# Patient Record
Sex: Female | Born: 1965 | Race: White | Hispanic: No | Smoking: Current every day smoker
Health system: Southern US, Community
[De-identification: ages and names within clinical notes are randomized; demographics above are authoritative.]

## PROBLEM LIST (undated history)

## (undated) ENCOUNTER — Ambulatory Visit: Payer: BC Managed Care – PPO

## (undated) DIAGNOSIS — K219 Gastro-esophageal reflux disease without esophagitis: Secondary | ICD-10-CM

## (undated) DIAGNOSIS — I10 Essential (primary) hypertension: Secondary | ICD-10-CM

## (undated) DIAGNOSIS — J449 Chronic obstructive pulmonary disease, unspecified: Secondary | ICD-10-CM

## (undated) DIAGNOSIS — E7800 Pure hypercholesterolemia, unspecified: Secondary | ICD-10-CM

## (undated) DIAGNOSIS — F419 Anxiety disorder, unspecified: Secondary | ICD-10-CM

## (undated) DIAGNOSIS — F32A Depression, unspecified: Secondary | ICD-10-CM

## (undated) DIAGNOSIS — T7840XA Allergy, unspecified, initial encounter: Secondary | ICD-10-CM

## (undated) DIAGNOSIS — M1990 Unspecified osteoarthritis, unspecified site: Secondary | ICD-10-CM

## (undated) HISTORY — DX: Allergy, unspecified, initial encounter: T78.40XA

## (undated) HISTORY — DX: Depression, unspecified: F32.A

## (undated) HISTORY — PX: OTHER SURGICAL HISTORY: SHX169

## (undated) HISTORY — PX: ABDOMINAL HYSTERECTOMY: SHX81

## (undated) HISTORY — DX: Anxiety disorder, unspecified: F41.9

## (undated) HISTORY — DX: Chronic obstructive pulmonary disease, unspecified: J44.9

## (undated) HISTORY — PX: TUBAL LIGATION: SHX77

## (undated) HISTORY — PX: JOINT REPLACEMENT: SHX530

## (undated) HISTORY — PX: REPLACEMENT TOTAL KNEE: SUR1224

---

## 2004-10-23 ENCOUNTER — Emergency Department: Payer: Self-pay | Admitting: Emergency Medicine

## 2005-12-29 ENCOUNTER — Emergency Department: Payer: Self-pay | Admitting: Unknown Physician Specialty

## 2006-12-26 ENCOUNTER — Emergency Department: Payer: Self-pay | Admitting: Emergency Medicine

## 2007-05-25 ENCOUNTER — Emergency Department: Payer: Self-pay | Admitting: Emergency Medicine

## 2008-07-19 ENCOUNTER — Ambulatory Visit: Payer: Self-pay | Admitting: Internal Medicine

## 2008-07-19 LAB — CONVERTED CEMR LAB
ALT: 24 units/L (ref 0–35)
Alkaline Phosphatase: 83 units/L (ref 39–117)
CO2: 22 meq/L (ref 19–32)
Cholesterol: 207 mg/dL — ABNORMAL HIGH (ref 0–200)
Creatinine, Ser: 0.84 mg/dL (ref 0.40–1.20)
GC Probe Amp, Urine: NEGATIVE
LDL Cholesterol: 109 mg/dL — ABNORMAL HIGH (ref 0–99)
Sodium: 136 meq/L (ref 135–145)
Total Bilirubin: 0.3 mg/dL (ref 0.3–1.2)
Total CHOL/HDL Ratio: 4.1
Total Protein: 7.4 g/dL (ref 6.0–8.3)
Triglycerides: 235 mg/dL — ABNORMAL HIGH (ref <150)
VLDL: 47 mg/dL — ABNORMAL HIGH (ref 0–40)

## 2008-08-02 ENCOUNTER — Ambulatory Visit: Payer: Self-pay | Admitting: Internal Medicine

## 2008-11-30 ENCOUNTER — Emergency Department: Payer: Self-pay | Admitting: Emergency Medicine

## 2009-02-21 ENCOUNTER — Emergency Department: Payer: Self-pay | Admitting: Emergency Medicine

## 2009-07-29 ENCOUNTER — Emergency Department: Payer: Self-pay | Admitting: Emergency Medicine

## 2009-08-05 ENCOUNTER — Emergency Department: Payer: Self-pay | Admitting: Emergency Medicine

## 2009-10-04 ENCOUNTER — Emergency Department: Payer: Self-pay | Admitting: Internal Medicine

## 2009-10-05 ENCOUNTER — Emergency Department: Payer: Self-pay | Admitting: Unknown Physician Specialty

## 2009-11-21 ENCOUNTER — Emergency Department: Payer: Self-pay | Admitting: Emergency Medicine

## 2010-01-23 ENCOUNTER — Emergency Department: Payer: Self-pay | Admitting: Emergency Medicine

## 2010-07-12 ENCOUNTER — Emergency Department: Payer: Self-pay | Admitting: Emergency Medicine

## 2011-02-06 ENCOUNTER — Emergency Department: Payer: Self-pay | Admitting: Unknown Physician Specialty

## 2011-03-15 ENCOUNTER — Inpatient Hospital Stay: Payer: Self-pay | Admitting: Psychiatry

## 2011-06-04 ENCOUNTER — Inpatient Hospital Stay: Payer: Self-pay | Admitting: Internal Medicine

## 2011-06-05 ENCOUNTER — Inpatient Hospital Stay: Payer: Self-pay | Admitting: Psychiatry

## 2011-06-19 ENCOUNTER — Emergency Department: Payer: Self-pay | Admitting: Emergency Medicine

## 2011-10-30 ENCOUNTER — Emergency Department: Payer: Self-pay | Admitting: *Deleted

## 2012-05-15 ENCOUNTER — Emergency Department: Payer: Self-pay | Admitting: *Deleted

## 2012-05-15 LAB — URINALYSIS, COMPLETE
Bilirubin,UR: NEGATIVE
Ph: 5 (ref 4.5–8.0)
Protein: 100
RBC,UR: 16 /HPF (ref 0–5)
WBC UR: NONE SEEN /HPF (ref 0–5)

## 2012-09-25 ENCOUNTER — Emergency Department: Payer: Self-pay | Admitting: Emergency Medicine

## 2012-10-29 ENCOUNTER — Emergency Department: Payer: Self-pay | Admitting: Emergency Medicine

## 2012-12-02 ENCOUNTER — Emergency Department: Payer: Self-pay | Admitting: Unknown Physician Specialty

## 2014-05-16 ENCOUNTER — Emergency Department: Payer: Self-pay | Admitting: Internal Medicine

## 2016-08-16 ENCOUNTER — Emergency Department
Admission: EM | Admit: 2016-08-16 | Discharge: 2016-08-16 | Disposition: A | Discharge status: Home / Self Care | Payer: Self-pay | Attending: Emergency Medicine | Admitting: Emergency Medicine

## 2016-08-16 ENCOUNTER — Emergency Department: Payer: Self-pay

## 2016-08-16 DIAGNOSIS — R1011 Right upper quadrant pain: Secondary | ICD-10-CM | POA: Insufficient documentation

## 2016-08-16 HISTORY — DX: Unspecified osteoarthritis, unspecified site: M19.90

## 2016-08-16 HISTORY — DX: Gastro-esophageal reflux disease without esophagitis: K21.9

## 2016-08-16 HISTORY — DX: Essential (primary) hypertension: I10

## 2016-08-16 HISTORY — DX: Pure hypercholesterolemia, unspecified: E78.00

## 2016-08-16 LAB — URINALYSIS COMPLETE WITH MICROSCOPIC (ARMC ONLY)
BILIRUBIN URINE: NEGATIVE
Bacteria, UA: NONE SEEN
GLUCOSE, UA: NEGATIVE mg/dL
Leukocytes, UA: NEGATIVE
NITRITE: NEGATIVE
Protein, ur: NEGATIVE mg/dL
SPECIFIC GRAVITY, URINE: 1.009 (ref 1.005–1.030)
pH: 7 (ref 5.0–8.0)

## 2016-08-16 LAB — LIPASE, BLOOD: LIPASE: 35 U/L (ref 11–51)

## 2016-08-16 LAB — COMPREHENSIVE METABOLIC PANEL
ALBUMIN: 4.1 g/dL (ref 3.5–5.0)
ALT: 38 U/L (ref 14–54)
ANION GAP: 9 (ref 5–15)
AST: 36 U/L (ref 15–41)
Alkaline Phosphatase: 95 U/L (ref 38–126)
BILIRUBIN TOTAL: 0.9 mg/dL (ref 0.3–1.2)
BUN: 13 mg/dL (ref 6–20)
CO2: 27 mmol/L (ref 22–32)
Calcium: 9.4 mg/dL (ref 8.9–10.3)
Chloride: 99 mmol/L — ABNORMAL LOW (ref 101–111)
Creatinine, Ser: 0.75 mg/dL (ref 0.44–1.00)
GFR calc non Af Amer: >60 mL/min (ref >60)
GLUCOSE: 127 mg/dL — AB (ref 65–99)
POTASSIUM: 3.7 mmol/L (ref 3.5–5.1)
Sodium: 135 mmol/L (ref 135–145)
TOTAL PROTEIN: 7.5 g/dL (ref 6.5–8.1)

## 2016-08-16 LAB — CBC
HEMATOCRIT: 41.6 % (ref 35.0–47.0)
HEMOGLOBIN: 14.6 g/dL (ref 12.0–16.0)
MCH: 30.7 pg (ref 26.0–34.0)
MCHC: 35 g/dL (ref 32.0–36.0)
MCV: 87.6 fL (ref 80.0–100.0)
Platelets: 204 10*3/uL (ref 150–440)
RBC: 4.75 MIL/uL (ref 3.80–5.20)
RDW: 13.4 % (ref 11.5–14.5)
WBC: 10 10*3/uL (ref 3.6–11.0)

## 2016-08-16 LAB — TROPONIN I

## 2016-08-16 MED ORDER — MORPHINE SULFATE (PF) 4 MG/ML IV SOLN
4.0000 mg | Freq: Once | INTRAVENOUS | Status: AC
  Administered 2016-08-16: 4 mg via INTRAVENOUS

## 2016-08-16 MED ORDER — ONDANSETRON HCL 4 MG/2ML IJ SOLN
INTRAMUSCULAR | Status: AC
  Administered 2016-08-16: 4 mg via INTRAVENOUS
  Filled 2016-08-16: qty 2

## 2016-08-16 MED ORDER — ONDANSETRON HCL 4 MG/2ML IJ SOLN
4.0000 mg | Freq: Once | INTRAMUSCULAR | Status: AC
  Administered 2016-08-16: 4 mg via INTRAVENOUS

## 2016-08-16 MED ORDER — SODIUM CHLORIDE 0.9 % IV BOLUS (SEPSIS)
1000.0000 mL | Freq: Once | INTRAVENOUS | Status: AC
  Administered 2016-08-16: 1000 mL via INTRAVENOUS

## 2016-08-16 MED ORDER — MORPHINE SULFATE (PF) 4 MG/ML IV SOLN
INTRAVENOUS | Status: AC
  Administered 2016-08-16: 4 mg via INTRAVENOUS
  Filled 2016-08-16: qty 1

## 2016-08-16 MED ORDER — HYDROCODONE-ACETAMINOPHEN 5-325 MG PO TABS: 1.0000 | ORAL_TABLET | ORAL | 0 refills | Status: DC | PRN

## 2016-08-16 NOTE — ED Provider Notes (Signed)
[facility] Emergency Department Provider Note  Time seen: 9:41 AM  I have reviewed the triage vital signs and the nursing notes.   HISTORY  Chief Complaint Abdominal Pain    HPI Laurie Mclaughlin is a 50 y.o. female with no significant past medical history who presents to the emergency department with epigastric and right upper quadrant pain. According to the patient for the past 23 days she has been feeling somewhat nauseated, especially after she eats food. States she has vomited several times. She states this morning she began experiencing epigastric and right upper quadrant pain only dry heaving. Denies any history of the same. States she still has her gallbladder intact. Denies fever. Denies dysuria. Denies lower abdominal pain. Denies diarrhea.Describes his discomfort as a burning sensation located in the epigastrium and right upper quadrant, moderate in severity.  No past medical history on file.  There are no active problems to display for this patient.   No past surgical history on file.  Prior to Admission medications   Not on File    Allergies not on file  No family history on file.  Social History Social History  Substance Use Topics  . Smoking status: Not on file  . Smokeless tobacco: Not on file  . Alcohol use Not on file    Review of Systems Constitutional: Negative for fever. Cardiovascular: Negative for chest pain. Respiratory: Negative for shortness of breath. Gastrointestinal: Epigastric/right upper quadrant pain. Positive for nausea, vomiting. Genitourinary: Negative for dysuria. Musculoskeletal: Patient states some radiation of pain to the back. Neurological: Negative for headache 10-point ROS otherwise negative.  ____________________________________________   PHYSICAL EXAM:  Constitutional: Alert and oriented. Well appearing and in no distress. Eyes: Normal exam ENT   Head: Normocephalic and atraumatic.    Mouth/Throat: Mucous membranes are moist. Cardiovascular: Normal rate, regular rhythm. No murmur Respiratory: Normal respiratory effort without tachypnea nor retractions. Breath sounds are clear Gastrointestinal: Soft, moderate epigastric and right upper quadrant tenderness, no rebound or guarding. No distention. Musculoskeletal: Nontender with normal range of motion in all extremities.  Neurologic:  Normal speech and language. No gross focal neurologic deficits are appreciated. Skin:  Skin is warm, dry and intact.  Psychiatric: Mood and affect are normal.   ____________________________________________   RADIOLOGY  Ultrasound shows fatty liver otherwise negative  ____________________________________________   INITIAL IMPRESSION / ASSESSMENT AND PLAN / ED COURSE  Pertinent labs & imaging results that were available during my care of the patient were reviewed by me and considered in my medical decision making (see chart for details).  The patient presents to the emergency department with epigastric right upper quadrant pain. Patient states nausea and vomiting for the past 2-3 days, but pain started this morning. We will check labs, obtain an ultrasound of the RIGHT UPPER QUADRANT, TREAT PAIN AND NAUSEA, IV HYDRATE, AND CLOSELY MONITOR IN THE EMERGENCY DEPARTMENT WHILE AWAITING RESULTS.  Ultrasound consistent with fatty liver disease, otherwise negative. Patient states her pain is much improved. We'll discharge with a short course of pain medication and PCP follow-up. ____________________________________________   FINAL CLINICAL IMPRESSION(S) / ED DIAGNOSES  Upper abdominal pain Nausea and vomiting Hepatic steatosis   Minna AntisKevin Valmore Arabie, MD 08/16/16 1142

## 2016-08-16 NOTE — ED Notes (Signed)

## 2016-08-16 NOTE — ED Triage Notes (Signed)
Pt came to ED via EMS c/o right upper quadrant abdominal pain. Reports pain for the past few days, nausea and vomiting. Reports a burning sensation.

## 2019-10-14 ENCOUNTER — Other Ambulatory Visit: Payer: Self-pay

## 2019-10-14 ENCOUNTER — Emergency Department
Admission: EM | Admit: 2019-10-14 | Discharge: 2019-10-14 | Disposition: A | Discharge status: Home / Self Care | Payer: Self-pay | Attending: Emergency Medicine | Admitting: Emergency Medicine

## 2019-10-14 DIAGNOSIS — K0889 Other specified disorders of teeth and supporting structures: Secondary | ICD-10-CM | POA: Insufficient documentation

## 2019-10-14 DIAGNOSIS — I10 Essential (primary) hypertension: Secondary | ICD-10-CM | POA: Insufficient documentation

## 2019-10-14 DIAGNOSIS — F17210 Nicotine dependence, cigarettes, uncomplicated: Secondary | ICD-10-CM | POA: Insufficient documentation

## 2019-10-14 MED ORDER — HYDROCODONE-ACETAMINOPHEN 5-325 MG PO TABS: 1.0000 | ORAL_TABLET | Freq: Four times a day (QID) | ORAL | 0 refills | Status: DC | PRN

## 2019-10-14 MED ORDER — CLINDAMYCIN HCL 150 MG PO CAPS
300.0000 mg | ORAL_CAPSULE | Freq: Once | ORAL | Status: AC
  Administered 2019-10-14: 300 mg via ORAL
  Filled 2019-10-14: qty 2

## 2019-10-14 MED ORDER — HYDROCODONE-ACETAMINOPHEN 5-325 MG PO TABS
1.0000 | ORAL_TABLET | Freq: Once | ORAL | Status: AC
  Administered 2019-10-14: 1 via ORAL
  Filled 2019-10-14: qty 1

## 2019-10-14 MED ORDER — ONDANSETRON 4 MG PO TBDP
4.0000 mg | ORAL_TABLET | Freq: Once | ORAL | Status: AC
  Administered 2019-10-14: 4 mg via ORAL
  Filled 2019-10-14: qty 1

## 2019-10-14 MED ORDER — HYDROCODONE-ACETAMINOPHEN 5-325 MG PO TABS: 1.0000 | ORAL_TABLET | Freq: Four times a day (QID) | ORAL | 0 refills | Status: AC | PRN

## 2019-10-14 NOTE — ED Notes (Signed)
Pt c/o dental pain and not being able to sleep. Requesting w/c to the room.

## 2019-10-14 NOTE — ED Triage Notes (Signed)
Pt to the er via ems for dental pain. Pt has a broken tooth on the bottom right side around the crown. Pt has not been seen. Pt thinks she has an abcess. Pt has been taking amoxicillan someone gave her. Pt says she has never had a toothache like this. Pt says the pain stopped for a week.

## 2019-10-14 NOTE — ED Provider Notes (Signed)
[facility] Emergency Department Provider Note  ____________________________________________  Time seen: Approximately 8:49 PM  I have reviewed the triage vital signs and the nursing notes.   HISTORY  Chief Complaint Dental Pain    HPI Laurie Mclaughlin is a 53 y.o. female presents to the emergency department with inferior 29 and 28 pain.  Patient states that she has had some mild right-sided lower jaw discomfort and swelling.  She has been taking a friend amoxicillin.  Patient states that she called EMS today as she could not take the pain any longer.  She denies pain underneath the tongue or fever or chills.  She has been able to swallow easily.  Patient states that she has not made an appointment with a local dentist as she does not have dental insurance.        Past Medical History:  Diagnosis Date  . Acid reflux   . Arthritis   . High cholesterol   . Hypertension     There are no active problems to display for this patient.   Past Surgical History:  Procedure Laterality Date  . ABDOMINAL HYSTERECTOMY    . REPLACEMENT TOTAL KNEE    . rotator cuff surgery      Prior to Admission medications   Medication Sig Start Date End Date Taking? Authorizing Provider  HYDROcodone-acetaminophen (NORCO) 5-325 MG tablet Take 1 tablet by mouth every 6 (six) hours as needed for up to 3 days for moderate pain. 10/14/19 10/17/19  Orvil Feil, PA-C    Allergies Hydrochlorothiazide  No family history on file.  Social History Social History   Tobacco Use  . Smoking status: Current Every Day Smoker    Packs/day: 0.50    Types: Cigarettes  . Smokeless tobacco: Never Used  Substance Use Topics  . Alcohol use: Not Currently    Comment: ocassionally  . Drug use: Never     Review of Systems  Constitutional: No fever/chills Eyes: No visual changes. No discharge ENT: Patient has Inferior 28 and 29 pain.  Cardiovascular: no chest pain. Respiratory: no  cough. No SOB. Gastrointestinal: No abdominal pain.  No nausea, no vomiting.  No diarrhea.  No constipation. Genitourinary: Negative for dysuria. No hematuria Musculoskeletal: Negative for musculoskeletal pain. Skin: Negative for rash, abrasions, lacerations, ecchymosis. Neurological: Negative for headaches, focal weakness or numbness.   ____________________________________________   PHYSICAL EXAM:  VITAL SIGNS: ED Triage Vitals  Enc Vitals Group     BP 10/14/19 1934 (!) 184/95     Pulse Rate 10/14/19 1934 98     Resp 10/14/19 1934 18     Temp 10/14/19 1934 98.8 F (37.1 C)     Temp Source 10/14/19 1934 Oral     SpO2 10/14/19 1934 97 %     Weight 10/14/19 1931 180 lb (81.6 kg)     Height 10/14/19 1931 5\' 7"  (1.702 m)     Head Circumference --      Peak Flow --      Pain Score 10/14/19 1930 10     Pain Loc --      Pain Edu? --      Excl. in GC? --      Constitutional: Alert and oriented. Well appearing and in no acute distress. Eyes: Conjunctivae are normal. PERRL. EOMI. Head: Atraumatic. ENT:      Ears: TMs are pearly.       Nose: No congestion/rhinnorhea.      Mouth/Throat: Mucous membranes are moist.  No  pain to palpation underneath the tongue.  Mild swelling of the right lower jaw visualized. Neck: No stridor.  No cervical spine tenderness to palpation. Cardiovascular: Normal rate, regular rhythm. Normal S1 and S2.  Good peripheral circulation. Respiratory: Normal respiratory effort without tachypnea or retractions. Lungs CTAB. Good air entry to the bases with no decreased or absent breath sounds. Skin:  Skin is warm, dry and intact. No rash noted. Psychiatric: Mood and affect are normal. Speech and behavior are normal. Patient exhibits appropriate insight and judgement.   ____________________________________________   LABS (all labs ordered are listed, but only abnormal results are displayed)  Labs Reviewed - No data to  display ____________________________________________  EKG   ____________________________________________  RADIOLOGY  No results found.  ____________________________________________    PROCEDURES  Procedure(s) performed:    Procedures    Medications  HYDROcodone-acetaminophen (NORCO/VICODIN) 5-325 MG per tablet 1 tablet (has no administration in time range)  clindamycin (CLEOCIN) capsule 300 mg (has no administration in time range)  ondansetron (ZOFRAN-ODT) disintegrating tablet 4 mg (has no administration in time range)     ____________________________________________   INITIAL IMPRESSION / ASSESSMENT AND PLAN / ED COURSE  Pertinent labs & imaging results that were available during my care of the patient were reviewed by me and considered in my medical decision making (see chart for details).  Review of the [place] CSRS was performed in accordance of the [place] prior to dispensing any controlled drugs.           Assessment and plan Dental pain 53 year old female presents to the emergency department with inferior 28 and 29 pain that has been going on for the past 2 days.  On physical exam, patient had no pain underneath the tongue was able to swallow easily.  Norco was given in the emergency department for pain and patient was discharged with a short course of [place] drug database was reviewed during this emergency department encounter and patient was cautioned that a refill of Norco would not be given should she return to the emergency department with refractory dental pain.  She was advised to make an appointment with a local dentist as soon as possible.  Patient was transitioned to clindamycin.    ____________________________________________  FINAL CLINICAL IMPRESSION(S) / ED DIAGNOSES  Final diagnoses:  Pain, dental      NEW MEDICATIONS STARTED DURING THIS VISIT:  ED Discharge Orders         Ordered    HYDROcodone-acetaminophen ([place])  5-325 MG tablet  Every 6 hours PRN,   Status:  Discontinued     10/14/19 2046    HYDROcodone-acetaminophen ([place]) 5-325 MG tablet  Every 6 hours PRN,   Status:  Discontinued     10/14/19 2046    HYDROcodone-acetaminophen ([place]) 5-325 MG tablet  Every 6 hours PRN     10/14/19 2047              This chart was dictated using voice recognition software/Dragon. Despite best efforts to proofread, errors can occur which can change the meaning. Any change was purely unintentional.    Lannie Fields, PA-C 10/14/19 2053    Nena Polio, MD 10/14/19 2312

## 2019-10-14 NOTE — ED Notes (Signed)
Pt took 1200mg  IBU about 1100am. Pt has not taken her lisinopril recently as she is out.

## 2022-01-24 ENCOUNTER — Emergency Department: Payer: Self-pay

## 2022-01-24 ENCOUNTER — Emergency Department
Admission: EM | Admit: 2022-01-24 | Discharge: 2022-01-24 | Disposition: A | Discharge status: Home / Self Care | Payer: Self-pay | Attending: Emergency Medicine | Admitting: Emergency Medicine

## 2022-01-24 ENCOUNTER — Other Ambulatory Visit: Payer: Self-pay

## 2022-01-24 DIAGNOSIS — L03116 Cellulitis of left lower limb: Secondary | ICD-10-CM | POA: Insufficient documentation

## 2022-01-24 DIAGNOSIS — I10 Essential (primary) hypertension: Secondary | ICD-10-CM | POA: Insufficient documentation

## 2022-01-24 LAB — BASIC METABOLIC PANEL
Anion gap: 7 (ref 5–15)
BUN: 19 mg/dL (ref 6–20)
CO2: 30 mmol/L (ref 22–32)
Calcium: 9.3 mg/dL (ref 8.9–10.3)
Chloride: 99 mmol/L (ref 98–111)
Creatinine, Ser: 0.78 mg/dL (ref 0.44–1.00)
GFR, Estimated: >60 mL/min (ref >60)
Glucose, Bld: 104 mg/dL — ABNORMAL HIGH (ref 70–99)
Potassium: 3.8 mmol/L (ref 3.5–5.1)
Sodium: 136 mmol/L (ref 135–145)

## 2022-01-24 LAB — CBC WITH DIFFERENTIAL/PLATELET
Abs Immature Granulocytes: 0.02 10*3/uL (ref 0.00–0.07)
Basophils Absolute: 0.1 10*3/uL (ref 0.0–0.1)
Basophils Relative: 1 %
Eosinophils Absolute: 0.2 10*3/uL (ref 0.0–0.5)
Eosinophils Relative: 3 %
HCT: 41.8 % (ref 36.0–46.0)
Hemoglobin: 13.6 g/dL (ref 12.0–15.0)
Immature Granulocytes: 0 %
Lymphocytes Relative: 33 %
Lymphs Abs: 2.5 10*3/uL (ref 0.7–4.0)
MCH: 28.5 pg (ref 26.0–34.0)
MCHC: 32.5 g/dL (ref 30.0–36.0)
MCV: 87.4 fL (ref 80.0–100.0)
Monocytes Absolute: 0.6 10*3/uL (ref 0.1–1.0)
Monocytes Relative: 8 %
Neutro Abs: 4.2 10*3/uL (ref 1.7–7.7)
Neutrophils Relative %: 55 %
Platelets: 209 10*3/uL (ref 150–400)
RBC: 4.78 MIL/uL (ref 3.87–5.11)
RDW: 12.6 % (ref 11.5–15.5)
WBC: 7.5 10*3/uL (ref 4.0–10.5)
nRBC: 0 % (ref 0.0–0.2)

## 2022-01-24 LAB — URIC ACID: Uric Acid, Serum: 4.7 mg/dL (ref 2.5–7.1)

## 2022-01-24 MED ORDER — IBUPROFEN 600 MG PO TABS
600.0000 mg | ORAL_TABLET | Freq: Once | ORAL | Status: AC
  Administered 2022-01-24: 600 mg via ORAL
  Filled 2022-01-24: qty 1

## 2022-01-24 MED ORDER — CEPHALEXIN 500 MG PO CAPS
500.0000 mg | ORAL_CAPSULE | Freq: Once | ORAL | Status: AC
  Administered 2022-01-24: 500 mg via ORAL
  Filled 2022-01-24: qty 1

## 2022-01-24 MED ORDER — OXYCODONE-ACETAMINOPHEN 7.5-325 MG PO TABS: 1.0000 | ORAL_TABLET | Freq: Four times a day (QID) | ORAL | 0 refills | Status: DC | PRN

## 2022-01-24 MED ORDER — NAPROXEN 500 MG PO TABS: 500.0000 mg | ORAL_TABLET | Freq: Two times a day (BID) | ORAL | Status: DC

## 2022-01-24 MED ORDER — CEPHALEXIN 500 MG PO CAPS: 500.0000 mg | ORAL_CAPSULE | Freq: Four times a day (QID) | ORAL | 0 refills | Status: AC

## 2022-01-24 MED ORDER — OXYCODONE-ACETAMINOPHEN 5-325 MG PO TABS
1.0000 | ORAL_TABLET | Freq: Once | ORAL | Status: AC
  Administered 2022-01-24: 1 via ORAL
  Filled 2022-01-24: qty 1

## 2022-01-24 NOTE — Discharge Instructions (Addendum)
Read and follow discharge care instruction take medication as directed 

## 2022-01-24 NOTE — ED Provider Notes (Signed)
[facility] Provider Note    Event Date/Time   First MD Initiated Contact with Patient 01/24/22 1901     (approximate)   History   Foot Pain   HPI Laurie Mclaughlin is a 56 y.o. female patient presents for left foot pain and edema for few days.  Denies provoking incident for complaint except for prolonged standing.  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  Denies fever.  Denies dyspnea or shortness of breath.  Patient has a history of anxiety/depression, HLD, HTN, and peripheral edema.  Appears in no acute distress.     Physical Exam   Triage Vital Signs: ED Triage Vitals  Enc Vitals Group     BP 01/24/22 1839 137/84     Pulse Rate 01/24/22 1839 72     Resp 01/24/22 1839 16     Temp 01/24/22 1839 98 F (36.7 C)     Temp Source 01/24/22 1839 Oral     SpO2 01/24/22 1839 99 %     Weight 01/24/22 1839 200 lb (90.7 kg)     Height 01/24/22 1839 5\' 9"  (1.753 m)     Head Circumference --      Peak Flow --      Pain Score --      Pain Loc --      Pain Edu? --      Excl. in GC? --     Most recent vital signs: Vitals:   01/24/22 1839 01/24/22 2150  BP: 137/84 (!) 127/97  Pulse: 72 69  Resp: 16 14  Temp: 98 F (36.7 C)   SpO2: 99% 99%    General: Awake, no distress.  CV:  Palpable posterior tibialis and dorsalis pedis pulses.  No pitting edema. Resp:  Normal effort.  Abd:  No distention.  Other:  Mild edema and erythema to the dorsal aspect of left foot.   ED Results / Procedures / Treatments   Labs (all labs ordered are listed, but only abnormal results are displayed) Labs Reviewed  BASIC METABOLIC PANEL - Abnormal; Notable for the following components:      Result Value   Glucose, Bld 104 (*)    All other components within normal limits  URIC ACID  CBC WITH DIFFERENTIAL/PLATELET     EKG     RADIOLOGY No acute findings.  Incidental finding of a heel spur. Radiation allergy confirmed no acute  findings.  PROCEDURES:  Critical Care performed: No  Procedures   MEDICATIONS ORDERED IN ED: Medications  cephALEXin (KEFLEX) capsule 500 mg (has no administration in time range)  ibuprofen (ADVIL) tablet 600 mg (has no administration in time range)  oxyCODONE-acetaminophen (PERCOCET/ROXICET) 5-325 MG per tablet 1 tablet (has no administration in time range)     IMPRESSION / MDM / ASSESSMENT AND PLAN / ED COURSE  I reviewed the triage vital signs and the nursing notes.                              Differential diagnosis includes, but is not limited to, cellulitis, gout, and venous stasis.  Further evaluation with labs and x-rays warranted.    FINAL CLINICAL IMPRESSION(S) / ED DIAGNOSES   Final diagnoses:  Cellulitis of left lower extremity     Rx / DC Orders   ED Discharge Orders          Ordered    cephALEXin (KEFLEX) 500 MG capsule  4 times daily        01/24/22 2140    naproxen (NAPROSYN) 500 MG tablet  2 times daily with meals        01/24/22 2140    oxyCODONE-acetaminophen (PERCOCET) 7.5-325 MG tablet  Every 6 hours PRN        01/24/22 2140             Note:  This document was prepared using Dragon voice recognition software and may include unintentional dictation errors.    Joni Reining, PA-C 01/24/22 2151    Sharman Cheek, MD 01/25/22 [phone]

## 2022-01-24 NOTE — ED Triage Notes (Signed)
Pt c/o a swollen red area to the left foot for the past couple of days. Denies injury

## 2022-11-27 NOTE — Progress Notes (Deleted)
    New patient visit   Patient: Laurie Mclaughlin   DOB: [birth year]   56 y.o. Female  MRN: [id number] Visit Date: 12/04/2022  Today's healthcare provider: Debera Lat, PA-C   No chief complaint on file.  Subjective    Laurie Mclaughlin is a 56 y.o. female who presents today as a new patient to establish care.  HPI  ***  Past Medical History:  Diagnosis Date   Acid reflux    Arthritis    High cholesterol    Hypertension    Past Surgical History:  Procedure Laterality Date   ABDOMINAL HYSTERECTOMY     REPLACEMENT TOTAL KNEE     rotator cuff surgery     No family status information on file.   No family history on file. Social History   Socioeconomic History   Marital status: Divorced    Spouse name: Not on file   Number of children: Not on file   Years of education: Not on file   Highest education level: Not on file  Occupational History   Not on file  Tobacco Use   Smoking status: Every Day    Packs/day: 0.50    Types: Cigarettes   Smokeless tobacco: Never  Substance and Sexual Activity   Alcohol use: Not Currently    Comment: ocassionally   Drug use: Never   Sexual activity: Not on file  Other Topics Concern   Not on file  Social History Narrative   Not on file   Social Determinants of Health   Financial Resource Strain: Not on file  Food Insecurity: Not on file  Transportation Needs: Not on file  Physical Activity: Not on file  Stress: Not on file  Social Connections: Not on file   Outpatient Medications Prior to Visit  Medication Sig   naproxen (NAPROSYN) 500 MG tablet Take 1 tablet (500 mg total) by mouth 2 (two) times daily with a meal.   oxyCODONE-acetaminophen (PERCOCET) 7.5-325 MG tablet Take 1 tablet by mouth every 6 (six) hours as needed for severe pain.   No facility-administered medications prior to visit.   Allergies  Allergen Reactions   Hydrochlorothiazide      There is no immunization history on file for this patient.  Health  Maintenance  Topic Date Due   COVID-19 Vaccine (1) Never done   HIV Screening  Never done   Hepatitis C Screening  Never done   DTaP/Tdap/Td (1 - Tdap) Never done   PAP SMEAR-Modifier  Never done   COLONOSCOPY (Pts 45-55yrs Insurance coverage will need to be confirmed)  Never done   Lung Cancer Screening  Never done   MAMMOGRAM  Never done   Zoster Vaccines- Shingrix (1 of 2) Never done   INFLUENZA VACCINE  06/30/2022   HPV VACCINES  Aged Out    Patient Care Team: Patient, No Pcp Per as PCP - General (General Practice)  Review of Systems  {Labs  Heme  Chem  Endocrine  Serology  Results Review (optional):23779}   Objective    There were no vitals taken for this visit. {Show previous vital signs (optional):23777}  Physical Exam ***  Depression Screen     No data to display         No results found for any visits on 12/04/22.  Assessment & Plan     ***

## 2022-11-30 DIAGNOSIS — M75100 Unspecified rotator cuff tear or rupture of unspecified shoulder, not specified as traumatic: Secondary | ICD-10-CM

## 2022-11-30 HISTORY — DX: Unspecified rotator cuff tear or rupture of unspecified shoulder, not specified as traumatic: M75.100

## 2022-12-04 ENCOUNTER — Ambulatory Visit: Payer: Self-pay | Admitting: Physician Assistant

## 2022-12-07 ENCOUNTER — Ambulatory Visit: Payer: Self-pay

## 2022-12-10 ENCOUNTER — Ambulatory Visit
Admission: EM | Admit: 2022-12-10 | Discharge: 2022-12-10 | Disposition: A | Discharge status: Home / Self Care | Payer: BC Managed Care – PPO | Attending: Physician Assistant | Admitting: Physician Assistant

## 2022-12-10 ENCOUNTER — Ambulatory Visit (INDEPENDENT_AMBULATORY_CARE_PROVIDER_SITE_OTHER): Payer: BC Managed Care – PPO

## 2022-12-10 DIAGNOSIS — M25512 Pain in left shoulder: Secondary | ICD-10-CM

## 2022-12-10 DIAGNOSIS — S46002A Unspecified injury of muscle(s) and tendon(s) of the rotator cuff of left shoulder, initial encounter: Secondary | ICD-10-CM

## 2022-12-10 NOTE — Discharge Instructions (Addendum)
-  Your x-ray does not show any fractures. - You should follow-up with orthopedics for an MRI of your shoulder given your history of rotator cuff tear.  I am concerned you could have another tear.  Do not reach overhead as that may worsen your symptoms.  You may perform the physical therapy exercises at home as tolerated. - May continue with NSAIDs, Tylenol.  Consider topical Voltaren gel, ice, heat, lidocaine patches.  You have a condition requiring you to follow up with Orthopedics so please call one of the following office for appointment:   [facility] [address] [place], [place] [address] Phone: [phone]  [facility] [address], [place], [place] [address] Phone: [phone]

## 2022-12-10 NOTE — ED Provider Notes (Signed)
[facility]    CSN: [id number] Arrival date & time: 12/10/22  3382      History   Chief Complaint Chief Complaint  Patient presents with   Shoulder Injury    HPI Laurie Mclaughlin is a 57 y.o. female presenting for approximately 1 and half week history of left-sided shoulder pain and reduced range of motion.  She says that she tripped over her dog and fell down a couple stairs.  She is unsure if she fell onto the shoulder or if she threw her arm out to catch herself.  She has had difficulty with overhead reaching and has been unable to do so.  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  She reports that her pain was so significant she had to contact somebody to come pick her up from work because just turning the steering wheel caused significant pain.  She is right-handed.  She denies any pain in her neck and no pain radiating down her arm.  No numbness, weakness or tingling.  Has been taking Advil.  She has a history of rotator cuff tear of this shoulder many years ago.  She also reports that she just completed physical therapy for reduced range of motion in her right shoulder.  No other complaints.  HPI  Past Medical History:  Diagnosis Date   Acid reflux    Arthritis    High cholesterol    Hypertension     There are no problems to display for this patient.   Past Surgical History:  Procedure Laterality Date   ABDOMINAL HYSTERECTOMY     REPLACEMENT TOTAL KNEE     rotator cuff surgery      OB History   No obstetric history on file.      Home Medications    Prior to Admission medications   Not on File    Family History History reviewed. No pertinent family history.  Social History Social History   Tobacco Use   Smoking status: Every Day    Packs/day: 0.50    Types: Cigarettes   Smokeless tobacco: Never  Substance Use Topics   Alcohol use: Not Currently    Comment: ocassionally   Drug use: Never     Allergies    Hydrochlorothiazide   Review of Systems Review of Systems  Musculoskeletal:  Positive for arthralgias. Negative for back pain, joint swelling and neck pain.  Skin:  Negative for color change and wound.  Neurological:  Negative for weakness and numbness.     Physical Exam Triage Vital Signs ED Triage Vitals  Enc Vitals Group     BP 12/10/22 0831 118/79     Pulse Rate 12/10/22 0831 79     Resp 12/10/22 0831 20     Temp 12/10/22 0831 98 F (36.7 C)     Temp Source 12/10/22 0831 Oral     SpO2 12/10/22 0831 100 %     Weight --      Height --      Head Circumference --      Peak Flow --      Pain Score 12/10/22 0834 10     Pain Loc --      Pain Edu? --      Excl. in [place]? --    No data found.  Updated Vital Signs BP 118/79 (BP Location: Right Arm)   Pulse 79   Temp 98 F (36.7 C) (Oral)   Resp  20   SpO2 100%      Physical Exam Vitals and nursing note reviewed.  Constitutional:      General: She is not in acute distress.    Appearance: Normal appearance. She is not ill-appearing or toxic-appearing.  HENT:     Head: Normocephalic and atraumatic.  Eyes:     General: No scleral icterus.       Right eye: No discharge.        Left eye: No discharge.     Conjunctiva/sclera: Conjunctivae normal.  Cardiovascular:     Rate and Rhythm: Normal rate and regular rhythm.     Heart sounds: Normal heart sounds.  Pulmonary:     Effort: Pulmonary effort is normal. No respiratory distress.     Breath sounds: Normal breath sounds.  Musculoskeletal:     Left shoulder: Tenderness (minimal TTP. TTP of biceps groove and lateral deltoid) present. No deformity. Decreased range of motion (90 degrees flexion and abduction). Normal pulse.     Cervical back: Neck supple.     Comments: +empty can test  Skin:    General: Skin is dry.  Neurological:     General: No focal deficit present.     Mental Status: She is alert. Mental status is at baseline.     Motor: No weakness.     Gait:  Gait normal.  Psychiatric:        Mood and Affect: Mood normal.        Behavior: Behavior normal.        Thought Content: Thought content normal.      UC Treatments / Results  Labs (all labs ordered are listed, but only abnormal results are displayed) Labs Reviewed - No data to display  EKG   Radiology DG Shoulder Left  Result Date: 12/10/2022 CLINICAL DATA:  Pain, recent fall EXAM: LEFT SHOULDER - 2+ VIEW COMPARISON:  05/16/2014 FINDINGS: No recent fracture or dislocation is seen in left shoulder pain degenerative changes are noted with small bony spurs in glenoid and medial inferior aspect of head of the humerus. There are subcortical cysts in lateral aspect of proximal humerus, possibly due to degenerative changes. There is possible mild deformity in the anterior end of left third rib. IMPRESSION: No recent fracture or dislocation is seen in left shoulder. Degenerative changes are noted with bony spurs and subcortical cysts. There is possible deformity in the anterior end of left third rib which may be an artifact or suggest recent or old undisplaced fracture. Electronically Signed   By: Elmer Picker M.D.   On: 12/10/2022 09:04    Procedures Procedures (including critical care time)  Medications Ordered in UC Medications - No data to display  Initial Impression / Assessment and Plan / UC Course  I have reviewed the triage vital signs and the nursing notes.  Pertinent labs & imaging results that were available during my care of the patient were reviewed by me and considered in my medical decision making (see chart for details).   57 year old female presents for 1 and half week history of left shoulder pain after a fall.  History of rotator cuff tear of affected arm.  Has been using NSAIDs with some relief.  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  She says overhead reaching increases her pain.  On exam she has minimal tenderness to palpation of  the actual shoulder.  Reduced range of motion of about 90 degrees flexion and  abduction.  She almost becomes tearful when she has to raise her arm further than that.  X-ray performed of the shoulder today does not show any acute fractures or dislocations.  There is a possible deformity at the end of the third rib.  She is not having any red tenderness.  Denies pleuritic pain.  Suspect likely old fracture.  Reviewed that she should follow-up with orthopedics as I have concerns about another rotator cuff tear especially given her history.  She reports that she will reach out to her orthopedist.  We also discussed trying to perform some of the PT exercises at home as tolerated.  Continue NSAIDs and Tylenol.  I offered something else for pain but she declined.  Offered sling but she declines.  Work note given.  Final Clinical Impressions(s) / UC Diagnoses   Final diagnoses:  Injury of left rotator cuff, initial encounter  Acute pain of left shoulder     Discharge Instructions      -Your x-ray does not show any fractures. - You should follow-up with orthopedics for an MRI of your shoulder given your history of rotator cuff tear.  I am concerned you could have another tear.  Do not reach overhead as that may worsen your symptoms.  You may perform the physical therapy exercises at home as tolerated. - May continue with NSAIDs, Tylenol.  Consider topical Voltaren gel, ice, heat, lidocaine patches.  You have a condition requiring you to follow up with Orthopedics so please call one of the following office for appointment:   [facility] [address] [place], [place] [address] Phone: [phone]  [facility] [address] [place], [place] [address] Phone: [phone]      ED Prescriptions   None    PDMP not reviewed this encounter.   Shirlee Latch, PA-C 12/10/22 [phone]

## 2022-12-10 NOTE — ED Triage Notes (Signed)
Pt reports she fell on concrete and now her left shoulder x 1.5 weeks. It made a popping sound then a crunching sound. Fells like something is separated in her arm due to fall and it hurts worst when she sits down than standing up. It is stiff

## 2022-12-14 ENCOUNTER — Encounter: Payer: Self-pay | Admitting: Physician Assistant

## 2022-12-14 ENCOUNTER — Ambulatory Visit (INDEPENDENT_AMBULATORY_CARE_PROVIDER_SITE_OTHER): Payer: BC Managed Care – PPO | Admitting: Physician Assistant

## 2022-12-14 VITALS — BP 126/80 | HR 89 | Wt 200.9 lb

## 2022-12-14 DIAGNOSIS — Z1231 Encounter for screening mammogram for malignant neoplasm of breast: Secondary | ICD-10-CM

## 2022-12-14 DIAGNOSIS — I872 Venous insufficiency (chronic) (peripheral): Secondary | ICD-10-CM | POA: Diagnosis not present

## 2022-12-14 DIAGNOSIS — Z23 Encounter for immunization: Secondary | ICD-10-CM | POA: Diagnosis not present

## 2022-12-14 DIAGNOSIS — M1990 Unspecified osteoarthritis, unspecified site: Secondary | ICD-10-CM

## 2022-12-14 DIAGNOSIS — Z7689 Persons encountering health services in other specified circumstances: Secondary | ICD-10-CM

## 2022-12-14 DIAGNOSIS — M25519 Pain in unspecified shoulder: Secondary | ICD-10-CM

## 2022-12-14 DIAGNOSIS — E7800 Pure hypercholesterolemia, unspecified: Secondary | ICD-10-CM

## 2022-12-14 DIAGNOSIS — Z0000 Encounter for general adult medical examination without abnormal findings: Secondary | ICD-10-CM

## 2022-12-14 DIAGNOSIS — K219 Gastro-esophageal reflux disease without esophagitis: Secondary | ICD-10-CM

## 2022-12-14 DIAGNOSIS — I10 Essential (primary) hypertension: Secondary | ICD-10-CM

## 2022-12-14 DIAGNOSIS — N393 Stress incontinence (female) (male): Secondary | ICD-10-CM

## 2022-12-14 NOTE — Progress Notes (Unsigned)
I,Sha'taria Tyson,acting as a Education administrator for [employer], PA-C.,have documented all relevant documentation on the behalf of Mardene Speak, PA-C,as directed by  [employer], PA-C while in the presence of [employer], PA-C.    New patient visit   Patient: Laurie Mclaughlin   DOB: 1966-08-17   57 y.o. Female  MRN: [id number] Visit Date: 12/14/2022  Today's healthcare provider: Mardene Speak, PA-C  CC: leg swelling and UTI symptoms?  Subjective    Laurie Mclaughlin is a 57 y.o. female who presents today as a new patient to establish care.  HPI  Patient reports having feet and leg swelling and itching. Reports having 2 UC visits recently due to falling and tore left rotator cuff ([facility]) Have not seen a PCP for a few years and  she would like to establish care.  Endorses taking metoprolol for irregular heart beats in the past, HTN / not on antihypertensive, high cholesterol/not on statin, COPD/not using inhalers States having Urinary symptoms today. Has a hx of SUI.   Healthcare maintenance: -Pap Smear: Patient had hysterectomy -HIV Screening: agreed to proceed -Hepatitis C Screening: agreed to proceed -Shingles: agreed to proceedyes   Past Medical History:  Diagnosis Date   Acid reflux    Allergy    Anxiety    Arthritis    COPD (chronic obstructive pulmonary disease) ([place])    Depression    High cholesterol    Hypertension    Past Surgical History:  Procedure Laterality Date   ABDOMINAL HYSTERECTOMY     JOINT REPLACEMENT     REPLACEMENT TOTAL KNEE     rotator cuff surgery     TUBAL LIGATION     No family status information on file.   No family history on file. Social History   Socioeconomic History   Marital status: Divorced    Spouse name: Not on file   Number of children: Not on file   Years of education: Not on file   Highest education level: Not on file  Occupational History   Not on file  Tobacco Use   Smoking status: Every Day    Packs/day: 0.50     Types: Cigarettes   Smokeless tobacco: Never  Substance and Sexual Activity   Alcohol use: Not Currently    Comment: ocassionally   Drug use: Never   Sexual activity: Not on file  Other Topics Concern   Not on file  Social History Narrative   Not on file   Social Determinants of Health   Financial Resource Strain: Not on file  Food Insecurity: Not on file  Transportation Needs: Not on file  Physical Activity: Not on file  Stress: Not on file  Social Connections: Not on file   No outpatient medications prior to visit.   No facility-administered medications prior to visit.   Allergies  Allergen Reactions   Hydrochlorothiazide Other (See Comments)    Leg and hand cramps    Immunization History  Administered Date(s) Administered   Influenza,inj,Quad PF,6+ Mos 11/11/2012, 10/23/2014, 11/04/2015, 10/15/2017   Pneumococcal Polysaccharide-23 10/23/2014   Tdap 12/27/2017    Health Maintenance  Topic Date Due   COVID-19 Vaccine (1) Never done   HIV Screening  Never done   Hepatitis C Screening  Never done   PAP SMEAR-Modifier  Never done   COLONOSCOPY (Pts 45-67yrs Insurance coverage will need to be confirmed)  Never done   Lung Cancer Screening  Never done   MAMMOGRAM  Never done  Zoster Vaccines- Shingrix (1 of 2) Never done   INFLUENZA VACCINE  06/30/2022   DTaP/Tdap/Td (2 - Td or Tdap) 12/28/2027   HPV VACCINES  Aged Out    Patient Care Team: Patient, No Pcp Per as PCP - General (General Practice)  Review of Systems  Constitutional:  Positive for appetite change and fatigue.  HENT:  Positive for dental problem.   Cardiovascular:  Positive for palpitations and leg swelling.  Gastrointestinal:  Positive for constipation.  Endocrine: Positive for cold intolerance, polydipsia and polyuria.  Genitourinary:  Positive for dyspareunia, dysuria, frequency and urgency.  Musculoskeletal:  Positive for arthralgias, back pain, myalgias, neck pain and neck stiffness.   Skin:  Positive for rash.  Neurological:  Positive for headaches.  Psychiatric/Behavioral:  Positive for decreased concentration and sleep disturbance.     {Labs  Heme  Chem  Endocrine  Serology  Results Review (optional):23779}   Objective    BP 126/80 (BP Location: Right Arm, Patient Position: Sitting, Cuff Size: Normal)   Pulse 89   Wt 200 lb 14.4 oz (91.1 kg)   BMI 29.67 kg/m  {Show previous vital signs (optional):23777}  Physical Exam Vitals reviewed.  Constitutional:      General: She is not in acute distress.    Appearance: Normal appearance. She is well-developed. She is not diaphoretic.  HENT:     Head: Normocephalic and atraumatic.  Eyes:     General: No scleral icterus.    Conjunctiva/sclera: Conjunctivae normal.  Neck:     Thyroid: No thyromegaly.  Cardiovascular:     Rate and Rhythm: Normal rate and regular rhythm.     Pulses: Normal pulses.     Heart sounds: Normal heart sounds. No murmur heard. Pulmonary:     Effort: Pulmonary effort is normal. No respiratory distress.     Breath sounds: Normal breath sounds. No wheezing, rhonchi or rales.  Musculoskeletal:     Cervical back: Neck supple.     Right lower leg: No edema.     Left lower leg: No edema.  Lymphadenopathy:     Cervical: No cervical adenopathy.  Skin:    General: Skin is warm and dry.     Findings: No rash.  Neurological:     Mental Status: She is alert and oriented to person, place, and time. Mental status is at baseline.  Psychiatric:        Mood and Affect: Mood normal.        Behavior: Behavior normal.     Depression Screen    12/14/2022    3:44 PM  PHQ 2/9 Scores  PHQ - 2 Score 3  PHQ- 9 Score 15   No results found for any visits on 12/14/22.  Assessment & Plan     Pt was late for her appt Pt agreed to proceed with most important problems and FU for the rest of her chronic conditions.  Venous insufficiency Chronic Symptomatic Advised conservative management,  mechanical treatment  Compression therapy is the standard of care for venous ulcers and chronic venous insufficiency and should be started as early as possible  Leg elevation above the level of the heart 30 minutes, 3 to 4 times a day. Inelastic compression therapy: provides pressure during ambulation and muscle contraction but no resting pressure; most common: Unna boot (zinc oxide moist bandage that hardens after application)  Elastic compression therapy: sustains compression during rest and activity. Compression stockings: Pressure should be at least 20 to 30 mm Hg and preferably 30  to 40 mm Hg. They should be removed at night and should be replaced every 6 months. Elastic bandages (i.e., Profore) are alternatives to compression stockings.  Compression stockings reduce the risk of venous insufficiency ulcer reoccurrence.  Exercise (e.g., activation of calf muscle pump with ankle flexion and extension).  Urinary problems Could be due to UTI or SUI POCT dipstick was negative for leuko and nitrites  SUI (stress urinary incontinence, female) Advised the following treatemnet:  6 to 8-wk trial of behavioral mod and pelvic floor training; if no improvement, refer to Urology. Conservative Management  I. Behavioral Techniques:  1)Bladder training (resist and expanding intervals bet. voiding); 2) Double voiding (voiding then waiting few min. trying again to empty bladder completely)  3) Toileting assistance (scheduled q2-4 hours).  4) Fluid-diet management: less EtOH, caffeine (<1 cup/day), or acidic food; <fluid intake before HS and <2L/day; diet changes; and wt loss (mod wt loss improves UI in BMI?30).  5) Lifestyle changes (>active, stop smoking).  II. PFMT (Kegel): effective for stress UI (may help urge UI);  bladder training, manual feedback (palpating pelvic musc during exercise), biofeedback (vaginal-anal device for visual-audio feedback on pelvic musc contraction), or electrical  stimulation.  Pelvic floor therapists improve technique, or use of weighted intravaginal cones.  Home electrode stimulation Tx (vagina, anus) is a Medicare-covered option (if unable to voluntarily contract pelvic musc).   Establishing care with new doctor, encounter for Will addressed care gaps and FU chronic conditions wtn a mo  In the setting of  Shoulder pain, unspecified chronicity, unspecified laterality Arthritis Managed by Ortho Hypertension, unspecified type Chronic and stable, not on antihypertensive High cholesterol Needs lipid panel Gastroesophageal reflux disease without esophagitis  Pt was advised to proceed with Kegel exercises

## 2022-12-15 DIAGNOSIS — N393 Stress incontinence (female) (male): Secondary | ICD-10-CM | POA: Insufficient documentation

## 2022-12-15 DIAGNOSIS — Z7689 Persons encountering health services in other specified circumstances: Secondary | ICD-10-CM | POA: Insufficient documentation

## 2022-12-15 DIAGNOSIS — Z1231 Encounter for screening mammogram for malignant neoplasm of breast: Secondary | ICD-10-CM | POA: Insufficient documentation

## 2022-12-15 DIAGNOSIS — I872 Venous insufficiency (chronic) (peripheral): Secondary | ICD-10-CM | POA: Insufficient documentation

## 2022-12-16 NOTE — Addendum Note (Signed)
Addended by: Barnie Mort on: 12/16/2022 10:48 AM   Modules accepted: Orders

## 2023-01-04 NOTE — Progress Notes (Incomplete)
      Established patient visit   Patient: Laurie Mclaughlin   DOB: 11/01/1966   57 y.o. Female  MRN: [id number] Visit Date: 01/05/2023  Today's healthcare provider: Mardene Speak, PA-C   No chief complaint on file.  Subjective    HPI  ***  Medications: No outpatient medications prior to visit.   No facility-administered medications prior to visit.    Review of Systems  {Labs  Heme  Chem  Endocrine  Serology  Results Review (optional):23779}   Objective    There were no vitals taken for this visit. {Show previous vital signs (optional):23777}  Physical Exam  ***  No results found for any visits on 01/05/23.  Assessment & Plan     ***  No follow-ups on file.      {provider attestation***:1}   Mardene Speak, PA-C  [facility] [phone] (phone) [phone] (fax)  [place]

## 2023-01-05 ENCOUNTER — Ambulatory Visit: Payer: BC Managed Care – PPO | Admitting: Physician Assistant

## 2023-01-05 DIAGNOSIS — N393 Stress incontinence (female) (male): Secondary | ICD-10-CM

## 2023-01-05 DIAGNOSIS — I10 Essential (primary) hypertension: Secondary | ICD-10-CM

## 2023-01-05 DIAGNOSIS — K219 Gastro-esophageal reflux disease without esophagitis: Secondary | ICD-10-CM

## 2023-01-05 DIAGNOSIS — E7800 Pure hypercholesterolemia, unspecified: Secondary | ICD-10-CM

## 2023-01-05 DIAGNOSIS — M1990 Unspecified osteoarthritis, unspecified site: Secondary | ICD-10-CM

## 2023-01-05 DIAGNOSIS — I872 Venous insufficiency (chronic) (peripheral): Secondary | ICD-10-CM

## 2023-01-16 ENCOUNTER — Ambulatory Visit
Admission: EM | Admit: 2023-01-16 | Discharge: 2023-01-16 | Disposition: A | Discharge status: Home / Self Care | Payer: BC Managed Care – PPO

## 2023-01-16 DIAGNOSIS — L03114 Cellulitis of left upper limb: Secondary | ICD-10-CM | POA: Diagnosis not present

## 2023-01-16 DIAGNOSIS — M7989 Other specified soft tissue disorders: Secondary | ICD-10-CM

## 2023-01-16 DIAGNOSIS — R110 Nausea: Secondary | ICD-10-CM

## 2023-01-16 MED ORDER — CEPHALEXIN 500 MG PO CAPS: 500.0000 mg | ORAL_CAPSULE | Freq: Four times a day (QID) | ORAL | 0 refills | Status: AC

## 2023-01-16 MED ORDER — CEFTRIAXONE SODIUM 1 G IJ SOLR
1.0000 g | Freq: Once | INTRAMUSCULAR | Status: AC
  Administered 2023-01-16: 1 g via INTRAMUSCULAR

## 2023-01-16 MED ORDER — DOXYCYCLINE HYCLATE 100 MG PO CAPS: 100.0000 mg | ORAL_CAPSULE | Freq: Two times a day (BID) | ORAL | 0 refills | Status: AC

## 2023-01-16 NOTE — Discharge Instructions (Signed)
-  You have significant infection in your arm. - We have given you an injection of antibiotic in the clinic today.  I have also sent you home with 2 different oral antibiotics. - Start taking doxycycline right away. - Start the Keflex in about 12 hours. - Use warm compresses and also ice alternating.  Elevate your extremity.  May continue with the Toradol for pain. - If you develop a fever or the redness worsens over the course of the day, is not improved by tomorrow or you are feeling generally worse at all you will need to go to the emergency department.  I cannot stress enough that you need to very closely monitor your condition and if you are feeling worse or the area is looking worse then you need to go immediately to the emergency department.  As we discussed, sometimes people have to receive IV antibiotics and he can be admitted for infections.

## 2023-01-16 NOTE — ED Triage Notes (Signed)
Pt c/o possible spider bite to left arm  Pt is having redness coming down the forearm and elbow starting today.  Pt has swelling along the lateral side of her forearm that she states had doubled in size since yesterday.   Pt has taken benadryl and ice to help with swelling.

## 2023-01-16 NOTE — ED Provider Notes (Signed)
[facility]    CSN: [id number] Arrival date & time: 01/16/23  [id number]      History   Chief Complaint Chief Complaint  Patient presents with   Insect Bite    HPI Laurie Mclaughlin is a 57 y.o. female presenting for redness, swelling and pain of the left arm.  Patient reports that she noticed a small red spot yesterday that was little itchy and painful.  She says when she woke up this morning she had more significant swelling and redness which has persistently worsened over the course of the morning.  She denies any fever but says that her skin is felt very hot.  She has not had any injuries.  She does report a history of cellulitis but no known history of MRSA.  Patient says she has taken Benadryl ice but has not helped.  She denies any insect bites to her knowledge.  She has not had any recent surgeries.  She has recently had an MRI of her left shoulder due to rotator cuff tear which she was seen here for last month.  Has been taking Toradol as needed for pain relief of the shoulder.  No other complaints.  HPI  Past Medical History:  Diagnosis Date   Acid reflux    Allergy    Anxiety    Arthritis    COPD (chronic obstructive pulmonary disease) ([place])    Depression    High cholesterol    Hypertension    Torn rotator cuff 11/30/2022    Patient Active Problem List   Diagnosis Date Noted   Venous insufficiency 12/15/2022   SUI (stress urinary incontinence, female) 12/15/2022   Encounter for screening mammogram for malignant neoplasm of breast 12/15/2022   Establishing care with new doctor, encounter for 12/15/2022    Past Surgical History:  Procedure Laterality Date   ABDOMINAL HYSTERECTOMY     JOINT REPLACEMENT     REPLACEMENT TOTAL KNEE     rotator cuff surgery     TUBAL LIGATION      OB History   No obstetric history on file.      Home Medications    Prior to Admission medications   Medication Sig Start Date End Date Taking? Authorizing Provider   cephALEXin (KEFLEX) 500 MG capsule Take 1 capsule (500 mg total) by mouth 4 (four) times daily for 7 days. 01/16/23 01/23/23 Yes Danton Clap, PA-C  doxycycline (VIBRAMYCIN) 100 MG capsule Take 1 capsule (100 mg total) by mouth 2 (two) times daily for 7 days. 01/16/23 01/23/23 Yes Laurene Footman B, PA-C  ketorolac (TORADOL) 10 MG tablet Take 10 mg by mouth every 6 (six) hours as needed. 01/07/23  Yes [provider]    Family History History reviewed. No pertinent family history.  Social History Social History   Tobacco Use   Smoking status: Every Day    Packs/day: 0.50    Types: Cigarettes   Smokeless tobacco: Never  Vaping Use   Vaping Use: Never used  Substance Use Topics   Alcohol use: Not Currently    Comment: ocassionally   Drug use: Never     Allergies   Hydrochlorothiazide   Review of Systems Review of Systems  Constitutional:  Negative for fatigue and fever.  Gastrointestinal:  Positive for nausea. Negative for vomiting.  Musculoskeletal:  Positive for arthralgias (left shoulder and arm) and joint swelling.  Skin:  Positive for color change. Negative for wound.  Neurological:  Negative for weakness and numbness.  Physical Exam Triage Vital Signs ED Triage Vitals  Enc Vitals Group     BP 01/16/23 1002 129/86     Pulse Rate 01/16/23 1002 84     Resp --      Temp 01/16/23 1002 98.1 F (36.7 C)     Temp Source 01/16/23 1002 Oral     SpO2 01/16/23 1002 92 %     Weight 01/16/23 0959 200 lb (90.7 kg)     Height 01/16/23 0959 5' 7"$  (1.702 m)     Head Circumference --      Peak Flow --      Pain Score 01/16/23 0959 4     Pain Loc --      Pain Edu? --      Excl. in [place]? --    No data found.  Updated Vital Signs BP 129/86 (BP Location: Left Arm)   Pulse 84   Temp 98.1 F (36.7 C) (Oral)   Ht 5' 7"$  (1.702 m)   Wt 200 lb (90.7 kg)   SpO2 92%   BMI 31.32 kg/m       Physical Exam Vitals and nursing note reviewed.  Constitutional:       General: She is not in acute distress.    Appearance: Normal appearance. She is not ill-appearing or toxic-appearing.  HENT:     Head: Normocephalic and atraumatic.     Nose: Nose normal.     Mouth/Throat:     Mouth: Mucous membranes are moist.     Pharynx: Oropharynx is clear.  Eyes:     General: No scleral icterus.       Right eye: No discharge.        Left eye: No discharge.     Conjunctiva/sclera: Conjunctivae normal.  Cardiovascular:     Rate and Rhythm: Normal rate and regular rhythm.     Heart sounds: Normal heart sounds.  Pulmonary:     Effort: Pulmonary effort is normal. No respiratory distress.     Breath sounds: Normal breath sounds.  Musculoskeletal:     Cervical back: Neck supple.  Skin:    General: Skin is dry.     Comments: See image of left arm below.  The line is drawn around the area of erythema, warmth and swelling.  She has a central more dark erythematous region which is indurated but not fluctuant.  That measures approximately 3 cm x 3 cm.  The dotted line extending the furthest out measures extremely faint erythema.  She has full range of motion of the elbow.  Only mild tenderness when I palpate over the arm.  Good pulses and strength.  Neurological:     General: No focal deficit present.     Mental Status: She is alert. Mental status is at baseline.     Motor: No weakness.     Gait: Gait normal.  Psychiatric:        Mood and Affect: Mood normal.        Behavior: Behavior normal.        Thought Content: Thought content normal.      UC Treatments / Results  Labs (all labs ordered are listed, but only abnormal results are displayed) Labs Reviewed - No data to display  EKG   Radiology No results found.  Procedures Procedures (including critical care time)  Medications Ordered in UC Medications  cefTRIAXone (ROCEPHIN) injection 1 g (1 g Intramuscular Given 01/16/23 1015)    Initial Impression / Assessment and Plan /  UC Course  I have reviewed  the triage vital signs and the nursing notes.  Pertinent labs & imaging results that were available during my care of the patient were reviewed by me and considered in my medical decision making (see chart for details).   57 year old female presents for erythema, swelling and pain of the left arm.  Symptom onset yesterday the patient reports only a small area of redness and swelling at that time.  Symptoms have persistently worsened today.  She has not had any fevers but she has been taking Toradol for a shoulder condition.  No recent surgeries.  No injuries.  Vitals are stable.  She is overall well-appearing.  I have included an image in the chart of her left arm.  It shows significant cellulitis.  There is also a central indurated area without fluctuance consistent with a developing abscess.  She has full range of motion of the elbow and arm.  Good pulses and strength.  Patient has significant cellulitis.  Will treat her very aggressively in outpatient setting.  She does not want to go to the emergency department at this time.  Patient given 1 g Rocephin.  Sent her home with prescriptions for doxycycline and Keflex.  Advised of supportive care.  Advised very close monitoring and if symptoms worsen throughout the day, she develops a fever or there is no improvement in the next 24 hours she needs to go to the emergency department.  Advised her to have a very low threshold for going to the ED.  Patient is understanding and agreeable.   Final Clinical Impressions(s) / UC Diagnoses   Final diagnoses:  Cellulitis of left upper extremity  Left arm swelling     Discharge Instructions      -You have significant infection in your arm. - We have given you an injection of antibiotic in the clinic today.  I have also sent you home with 2 different oral antibiotics. - Start taking doxycycline right away. - Start the Keflex in about 12 hours. - Use warm compresses and also ice alternating.  Elevate your  extremity.  May continue with the Toradol for pain. - If you develop a fever or the redness worsens over the course of the day, is not improved by tomorrow or you are feeling generally worse at all you will need to go to the emergency department.  I cannot stress enough that you need to very closely monitor your condition and if you are feeling worse or the area is looking worse then you need to go immediately to the emergency department.  As we discussed, sometimes people have to receive IV antibiotics and he can be admitted for infections.    ED Prescriptions     Medication Sig Dispense Auth. Provider   doxycycline (VIBRAMYCIN) 100 MG capsule Take 1 capsule (100 mg total) by mouth 2 (two) times daily for 7 days. 14 capsule Laurene Footman B, PA-C   cephALEXin (KEFLEX) 500 MG capsule Take 1 capsule (500 mg total) by mouth 4 (four) times daily for 7 days. 28 capsule Danton Clap, PA-C      PDMP not reviewed this encounter.   Danton Clap, PA-C 01/16/23 1114

## 2023-03-15 ENCOUNTER — Encounter: Payer: Self-pay | Admitting: Emergency Medicine

## 2023-03-15 ENCOUNTER — Ambulatory Visit
Admission: EM | Admit: 2023-03-15 | Discharge: 2023-03-15 | Disposition: A | Discharge status: Home / Self Care | Payer: BC Managed Care – PPO | Attending: Emergency Medicine | Admitting: Emergency Medicine

## 2023-03-15 DIAGNOSIS — J020 Streptococcal pharyngitis: Secondary | ICD-10-CM | POA: Diagnosis present

## 2023-03-15 LAB — GROUP A STREP BY PCR: Group A Strep by PCR: DETECTED — AB

## 2023-03-15 MED ORDER — IBUPROFEN 800 MG PO TABS: 800.0000 mg | ORAL_TABLET | Freq: Three times a day (TID) | ORAL | 0 refills | Status: AC

## 2023-03-15 MED ORDER — AMOXICILLIN 500 MG PO CAPS: 500.0000 mg | ORAL_CAPSULE | Freq: Two times a day (BID) | ORAL | 0 refills | Status: AC

## 2023-03-15 MED ORDER — NYSTATIN 100000 UNIT/ML MT SUSP: 5.0000 mL | Freq: Four times a day (QID) | OROMUCOSAL | 0 refills | Status: AC | PRN

## 2023-03-15 NOTE — Discharge Instructions (Signed)
Your rapid strep test today was positive  Take amoxicillin twice a day for the next 10 days, daily will see improvement in about 48 hours and steady progression from there  To be use of salt gargles throat lozenges, warm liquids, teaspoons of honey and over-the-counter clippers septic spray for comfort  May give Tylenol or Motrin every 6 hours as needed for additional comfort  You may follow-up at urgent care as needed    

## 2023-03-15 NOTE — ED Provider Notes (Signed)
[facility]    CSN: [id number] Arrival date & time: 03/15/23  1033      History   Chief Complaint Chief Complaint  Patient presents with   Sore Throat   Chills   Fatigue    HPI Laurie Mclaughlin is a 57 y.o. female.   Patient presents for evaluation of subjective fever, chills ,nasal congestion, sensation of fullness in the head and pressure to the bilateral ears and sore throat beginning 1 day ago.  Associated Fatigue.  Known sick contacts at work.  Attempted use of an old prescription of doxycycline, has taken 2 doses.  Decreased appetite.     Past Medical History:  Diagnosis Date   Acid reflux    Allergy    Anxiety    Arthritis    COPD (chronic obstructive pulmonary disease)    Depression    High cholesterol    Hypertension    Torn rotator cuff 11/30/2022    Patient Active Problem List   Diagnosis Date Noted   Venous insufficiency 12/15/2022   SUI (stress urinary incontinence, female) 12/15/2022   Encounter for screening mammogram for malignant neoplasm of breast 12/15/2022   Establishing care with new doctor, encounter for 12/15/2022    Past Surgical History:  Procedure Laterality Date   ABDOMINAL HYSTERECTOMY     JOINT REPLACEMENT     REPLACEMENT TOTAL KNEE     rotator cuff surgery     TUBAL LIGATION      OB History   No obstetric history on file.      Home Medications    Prior to Admission medications   Medication Sig Start Date End Date Taking? Authorizing Provider  ketorolac (TORADOL) 10 MG tablet Take 10 mg by mouth every 6 (six) hours as needed. 01/07/23   [provider]    Family History No family history on file.  Social History Social History   Tobacco Use   Smoking status: Every Day    Packs/day: .5    Types: Cigarettes   Smokeless tobacco: Never  Vaping Use   Vaping Use: Never used  Substance Use Topics   Alcohol use: Not Currently    Comment: ocassionally   Drug use: Never     Allergies    Hydrochlorothiazide   Review of Systems Review of Systems  Constitutional:  Positive for chills and fever. Negative for activity change, appetite change, diaphoresis, fatigue and unexpected weight change.  HENT:  Positive for congestion, hearing loss and sore throat. Negative for dental problem, drooling, ear discharge, ear pain, facial swelling, mouth sores, nosebleeds, postnasal drip, rhinorrhea, sinus pressure, sinus pain, sneezing, tinnitus, trouble swallowing and voice change.   Respiratory: Negative.    Cardiovascular: Negative.   Gastrointestinal: Negative.   Skin: Negative.      Physical Exam Triage Vital Signs ED Triage Vitals  Enc Vitals Group     BP --      Pulse Rate 03/15/23 1132 100     Resp 03/15/23 1132 18     Temp 03/15/23 1132 98.6 F (37 C)     Temp Source 03/15/23 1132 Oral     SpO2 03/15/23 1132 100 %     Weight --      Height --      Head Circumference --      Peak Flow --      Pain Score 03/15/23 1129 10     Pain Loc --      Pain Edu? --  Excl. in [place]? --    No data found.  Updated Vital Signs Pulse 100   Temp 98.6 F (37 C) (Oral)   Resp 18   SpO2 100%   Visual Acuity Right Eye Distance:   Left Eye Distance:   Bilateral Distance:    Right Eye Near:   Left Eye Near:    Bilateral Near:     Physical Exam Constitutional:      Appearance: She is well-developed.  HENT:     Head: Normocephalic.     Right Ear: Tympanic membrane and ear canal normal.     Left Ear: Tympanic membrane and ear canal normal.     Nose: Congestion present. No rhinorrhea.     Mouth/Throat:     Mouth: Mucous membranes are moist.     Pharynx: Posterior oropharyngeal erythema present.     Tonsils: Tonsillar exudate present. 2+ on the right. 2+ on the left.  Cardiovascular:     Rate and Rhythm: Normal rate and regular rhythm.     Heart sounds: Normal heart sounds.  Pulmonary:     Effort: Pulmonary effort is normal.     Breath sounds: Normal breath sounds.   Musculoskeletal:     Cervical back: Normal range of motion and neck supple.  Lymphadenopathy:     Cervical: Cervical adenopathy present.  Skin:    General: Skin is warm and dry.  Neurological:     General: No focal deficit present.     Mental Status: She is alert and oriented to person, place, and time.  Psychiatric:        Mood and Affect: Mood normal.        Behavior: Behavior normal.      UC Treatments / Results  Labs (all labs ordered are listed, but only abnormal results are displayed) Labs Reviewed  GROUP A STREP BY PCR - Abnormal; Notable for the following components:      Result Value   Group A Strep by PCR DETECTED (*)    All other components within normal limits    EKG   Radiology No results found.  Procedures Procedures (including critical care time)  Medications Ordered in UC Medications - No data to display  Initial Impression / Assessment and Plan / UC Course  I have reviewed the triage vital signs and the nursing notes.  Pertinent labs & imaging results that were available during my care of the patient were reviewed by me and considered in my medical decision making (see chart for details).  Strep Pharyngitis  Confirmed on PCR, discussed findings, amoxicillin prescribed as well as Magic mouthwash and ibuprofen for additional support and comfort, may follow-up with urgent care if symptoms persist or worsen Final Clinical Impressions(s) / UC Diagnoses   Final diagnoses:  Strep pharyngitis     Discharge Instructions      Your rapid strep test today was positive  Take amoxicillin twice a day for the next 10 days, daily will see improvement in about 48 hours and steady progression from there  To be use of salt gargles throat lozenges, warm liquids, teaspoons of honey and over-the-counter clippers septic spray for comfort  May give Tylenol or Motrin every 6 hours as needed for additional comfort  You may follow-up at urgent care as needed       ED Prescriptions   None    PDMP not reviewed this encounter.   Valinda Hoar, [place] 03/15/23 [phone]

## 2023-03-15 NOTE — ED Triage Notes (Signed)
Pt presents with a sore throat, chills and fatigue that started last night.

## 2023-03-31 ENCOUNTER — Ambulatory Visit
Admission: EM | Admit: 2023-03-31 | Discharge: 2023-03-31 | Disposition: A | Discharge status: Home / Self Care | Payer: BC Managed Care – PPO | Attending: Physician Assistant | Admitting: Physician Assistant

## 2023-03-31 ENCOUNTER — Encounter: Payer: Self-pay | Admitting: Emergency Medicine

## 2023-03-31 DIAGNOSIS — L03115 Cellulitis of right lower limb: Secondary | ICD-10-CM | POA: Diagnosis not present

## 2023-03-31 DIAGNOSIS — M25571 Pain in right ankle and joints of right foot: Secondary | ICD-10-CM | POA: Diagnosis not present

## 2023-03-31 DIAGNOSIS — L089 Local infection of the skin and subcutaneous tissue, unspecified: Secondary | ICD-10-CM | POA: Diagnosis not present

## 2023-03-31 MED ORDER — KETOROLAC TROMETHAMINE 10 MG PO TABS: 10.0000 mg | ORAL_TABLET | Freq: Four times a day (QID) | ORAL | 0 refills | Status: AC | PRN

## 2023-03-31 MED ORDER — CEPHALEXIN 500 MG PO CAPS: 500.0000 mg | ORAL_CAPSULE | Freq: Four times a day (QID) | ORAL | 0 refills | Status: AC

## 2023-03-31 MED ORDER — CEFTRIAXONE SODIUM 1 G IJ SOLR
1.0000 g | INTRAMUSCULAR | Status: DC
  Administered 2023-03-31: 1 g via INTRAMUSCULAR

## 2023-03-31 MED ORDER — KETOROLAC TROMETHAMINE 60 MG/2ML IM SOLN
30.0000 mg | Freq: Once | INTRAMUSCULAR | Status: AC
  Administered 2023-03-31: 30 mg via INTRAMUSCULAR

## 2023-03-31 MED ORDER — DOXYCYCLINE HYCLATE 100 MG PO CAPS: 100.0000 mg | ORAL_CAPSULE | Freq: Two times a day (BID) | ORAL | 0 refills | Status: AC

## 2023-03-31 NOTE — Discharge Instructions (Addendum)
-  You have significant infection in your ankle  - We have given you an injection of antibiotic in the clinic today.  I have also sent you home with 2 different oral antibiotics. Start those tomorrow if you cannot get them tonight - Use warm compresses and also ice alternating.  Elevate your extremity.  May continue with the Toradol for pain. - If you develop a fever or the redness worsens over the course of the day, is not improved by tomorrow or you are feeling generally worse at all you will need to go to the emergency department.  I cannot stress enough that you need to very closely monitor your condition and if you are feeling worse or the area is looking worse then you need to go immediately to the emergency department.  As we discussed, sometimes people have to receive IV antibiotics and he can be admitted for infections.

## 2023-03-31 NOTE — ED Provider Notes (Signed)
[facility]    CSN: [id number] Arrival date & time: 03/31/23  1912      History   Chief Complaint Chief Complaint  Patient presents with   Ankle Pain    HPI Laurie Mclaughlin is a 57 y.o. female.   HPI  Past Medical History:  Diagnosis Date   Acid reflux    Allergy    Anxiety    Arthritis    COPD (chronic obstructive pulmonary disease) ([facility])    Depression    High cholesterol    Hypertension    Torn rotator cuff 11/30/2022    Patient Active Problem List   Diagnosis Date Noted   Venous insufficiency 12/15/2022   SUI (stress urinary incontinence, female) 12/15/2022   Encounter for screening mammogram for malignant neoplasm of breast 12/15/2022   Establishing care with new doctor, encounter for 12/15/2022    Past Surgical History:  Procedure Laterality Date   ABDOMINAL HYSTERECTOMY     JOINT REPLACEMENT     REPLACEMENT TOTAL KNEE     rotator cuff surgery     TUBAL LIGATION      OB History   No obstetric history on file.      Home Medications    Prior to Admission medications   Medication Sig Start Date End Date Taking? Authorizing Provider  cephALEXin (KEFLEX) 500 MG capsule Take 1 capsule (500 mg total) by mouth 4 (four) times daily for 7 days. 03/31/23 04/07/23 Yes Shirlee Latch, PA-C  doxycycline (VIBRAMYCIN) 100 MG capsule Take 1 capsule (100 mg total) by mouth 2 (two) times daily for 7 days. 03/31/23 04/07/23 Yes Shirlee Latch, PA-C  ibuprofen (ADVIL) 800 MG tablet Take 1 tablet (800 mg total) by mouth 3 (three) times daily. 03/15/23   White, Elita Boone, NP  ketorolac (TORADOL) 10 MG tablet Take 1 tablet (10 mg total) by mouth every 6 (six) hours as needed for severe pain. 03/31/23   Eusebio Friendly B, PA-C  magic mouthwash (nystatin, lidocaine, diphenhydrAMINE, alum & mag hydroxide) suspension Swish and spit 5 mLs 4 (four) times daily as needed for mouth pain. 03/15/23   Valinda Hoar, NP    Family History No family history on file.  Social  History Social History   Tobacco Use   Smoking status: Every Day    Packs/day: .5    Types: Cigarettes   Smokeless tobacco: Never  Vaping Use   Vaping Use: Never used  Substance Use Topics   Alcohol use: Not Currently    Comment: ocassionally   Drug use: Never     Allergies   Hydrochlorothiazide   Review of Systems Review of Systems   Physical Exam Triage Vital Signs ED Triage Vitals [03/31/23 1925]  Enc Vitals Group     BP 114/70     Pulse Rate 83     Resp 18     Temp 98.6 F (37 C)     Temp Source Oral     SpO2 100 %     Weight      Height      Head Circumference      Peak Flow      Pain Score 8     Pain Loc      Pain Edu?      Excl. in [place]?    No data found.  Updated Vital Signs BP 114/70 (BP Location: Left Arm)   Pulse 83   Temp 98.6 F (37 C) (Oral)   Resp 18  SpO2 100%      Physical Exam Vitals and nursing note reviewed.  Constitutional:      General: She is not in acute distress.    Appearance: Normal appearance. She is not ill-appearing or toxic-appearing.  HENT:     Head: Normocephalic and atraumatic.     Nose: Nose normal.     Mouth/Throat:     Mouth: Mucous membranes are moist.     Pharynx: Oropharynx is clear.  Eyes:     General: No scleral icterus.       Right eye: No discharge.        Left eye: No discharge.     Conjunctiva/sclera: Conjunctivae normal.  Cardiovascular:     Rate and Rhythm: Normal rate and regular rhythm.     Heart sounds: Normal heart sounds.  Pulmonary:     Effort: Pulmonary effort is normal. No respiratory distress.     Breath sounds: Normal breath sounds.  Musculoskeletal:     Cervical back: Neck supple.  Skin:    General: Skin is dry.  Neurological:     General: No focal deficit present.     Mental Status: She is alert. Mental status is at baseline.     Motor: No weakness.     Gait: Gait normal.  Psychiatric:        Mood and Affect: Mood normal.        Behavior: Behavior normal.        Thought  Content: Thought content normal.      UC Treatments / Results  Labs (all labs ordered are listed, but only abnormal results are displayed) Labs Reviewed - No data to display  EKG   Radiology No results found.  Procedures Procedures (including critical care time)  Medications Ordered in UC Medications  cefTRIAXone (ROCEPHIN) injection 1 g (has no administration in time range)  ketorolac (TORADOL) injection 30 mg (has no administration in time range)    Initial Impression / Assessment and Plan / UC Course  I have reviewed the triage vital signs and the nursing notes.  Pertinent labs & imaging results that were available during my care of the patient were reviewed by me and considered in my medical decision making (see chart for details).     *** Final Clinical Impressions(s) / UC Diagnoses   Final diagnoses:  Cellulitis of right ankle  Acute right ankle pain     Discharge Instructions      -You have significant infection in your ankle  - We have given you an injection of antibiotic in the clinic today.  I have also sent you home with 2 different oral antibiotics. Start those tomorrow if you cannot get them tonight - Use warm compresses and also ice alternating.  Elevate your extremity.  May continue with the Toradol for pain. - If you develop a fever or the redness worsens over the course of the day, is not improved by tomorrow or you are feeling generally worse at all you will need to go to the emergency department.  I cannot stress enough that you need to very closely monitor your condition and if you are feeling worse or the area is looking worse then you need to go immediately to the emergency department.  As we discussed, sometimes people have to receive IV antibiotics and he can be admitted for infections.   ED Prescriptions     Medication Sig Dispense Auth. Provider   doxycycline (VIBRAMYCIN) 100 MG capsule Take 1 capsule (100  mg total) by mouth 2 (two) times  daily for 7 days. 14 capsule Eusebio Friendly B, PA-C   cephALEXin (KEFLEX) 500 MG capsule Take 1 capsule (500 mg total) by mouth 4 (four) times daily for 7 days. 28 capsule Eusebio Friendly B, PA-C   ketorolac (TORADOL) 10 MG tablet Take 1 tablet (10 mg total) by mouth every 6 (six) hours as needed for severe pain. 20 tablet Shirlee Latch, PA-C      I have reviewed the PDMP during this encounter.

## 2023-03-31 NOTE — ED Triage Notes (Signed)
Pt presents with right ankle pain and swelling since yesterday. Pt denies any injury.

## 2023-04-06 ENCOUNTER — Emergency Department: Payer: BC Managed Care – PPO

## 2023-04-06 ENCOUNTER — Emergency Department
Admission: EM | Admit: 2023-04-06 | Discharge: 2023-04-06 | Disposition: A | Discharge status: Home / Self Care | Payer: BC Managed Care – PPO | Attending: Emergency Medicine | Admitting: Emergency Medicine

## 2023-04-06 DIAGNOSIS — J449 Chronic obstructive pulmonary disease, unspecified: Secondary | ICD-10-CM | POA: Diagnosis not present

## 2023-04-06 DIAGNOSIS — R7989 Other specified abnormal findings of blood chemistry: Secondary | ICD-10-CM | POA: Diagnosis not present

## 2023-04-06 DIAGNOSIS — L089 Local infection of the skin and subcutaneous tissue, unspecified: Secondary | ICD-10-CM | POA: Diagnosis present

## 2023-04-06 DIAGNOSIS — I10 Essential (primary) hypertension: Secondary | ICD-10-CM | POA: Diagnosis not present

## 2023-04-06 DIAGNOSIS — L03115 Cellulitis of right lower limb: Secondary | ICD-10-CM | POA: Diagnosis not present

## 2023-04-06 LAB — CBC WITH DIFFERENTIAL/PLATELET
Abs Immature Granulocytes: 0.01 10*3/uL (ref 0.00–0.07)
Basophils Absolute: 0.1 10*3/uL (ref 0.0–0.1)
Basophils Relative: 1 %
Eosinophils Absolute: 0.2 10*3/uL (ref 0.0–0.5)
Eosinophils Relative: 3 %
HCT: 43.5 % (ref 36.0–46.0)
Hemoglobin: 13.7 g/dL (ref 12.0–15.0)
Immature Granulocytes: 0 %
Lymphocytes Relative: 28 %
Lymphs Abs: 1.3 10*3/uL (ref 0.7–4.0)
MCH: 28.2 pg (ref 26.0–34.0)
MCHC: 31.5 g/dL (ref 30.0–36.0)
MCV: 89.7 fL (ref 80.0–100.0)
Monocytes Absolute: 0.4 10*3/uL (ref 0.1–1.0)
Monocytes Relative: 8 %
Neutro Abs: 2.9 10*3/uL (ref 1.7–7.7)
Neutrophils Relative %: 60 %
Platelets: 229 10*3/uL (ref 150–400)
RBC: 4.85 MIL/uL (ref 3.87–5.11)
RDW: 12.5 % (ref 11.5–15.5)
WBC: 4.8 10*3/uL (ref 4.0–10.5)
nRBC: 0 % (ref 0.0–0.2)

## 2023-04-06 LAB — COMPREHENSIVE METABOLIC PANEL
ALT: 21 U/L (ref 0–44)
AST: 23 U/L (ref 15–41)
Albumin: 3.6 g/dL (ref 3.5–5.0)
Alkaline Phosphatase: 91 U/L (ref 38–126)
Anion gap: 9 (ref 5–15)
BUN: 12 mg/dL (ref 6–20)
CO2: 25 mmol/L (ref 22–32)
Calcium: 8.9 mg/dL (ref 8.9–10.3)
Chloride: 106 mmol/L (ref 98–111)
Creatinine, Ser: 0.68 mg/dL (ref 0.44–1.00)
GFR, Estimated: >60 mL/min (ref >60)
Glucose, Bld: 118 mg/dL — ABNORMAL HIGH (ref 70–99)
Potassium: 4.2 mmol/L (ref 3.5–5.1)
Sodium: 140 mmol/L (ref 135–145)
Total Bilirubin: 0.5 mg/dL (ref 0.3–1.2)
Total Protein: 7.2 g/dL (ref 6.5–8.1)

## 2023-04-06 LAB — LACTIC ACID, PLASMA
Lactic Acid, Venous: 2.3 mmol/L (ref 0.5–1.9)
Lactic Acid, Venous: 1.2 mmol/L (ref 0.5–1.9)

## 2023-04-06 MED ORDER — CEPHALEXIN 500 MG PO CAPS
500.0000 mg | ORAL_CAPSULE | Freq: Once | ORAL | Status: AC
  Administered 2023-04-06: 500 mg via ORAL
  Filled 2023-04-06: qty 1

## 2023-04-06 MED ORDER — DOXYCYCLINE HYCLATE 100 MG PO TABS
100.0000 mg | ORAL_TABLET | Freq: Once | ORAL | Status: AC
  Administered 2023-04-06: 100 mg via ORAL
  Filled 2023-04-06: qty 1

## 2023-04-06 MED ORDER — LACTATED RINGERS IV BOLUS
1000.0000 mL | Freq: Once | INTRAVENOUS | Status: AC
  Administered 2023-04-06: 1000 mL via INTRAVENOUS

## 2023-04-06 MED ORDER — CEPHALEXIN 500 MG PO CAPS: 500.0000 mg | ORAL_CAPSULE | Freq: Four times a day (QID) | ORAL | 0 refills | Status: AC

## 2023-04-06 NOTE — ED Provider Notes (Signed)
[facility] Provider Note    Event Date/Time   First MD Initiated Contact with Patient 04/06/23 1737     (approximate)   History   Recurrent Skin Infections   HPI  Laurie Mclaughlin is a 57 y.o. female past medical history of COPD hypertension hyperlipidemia depression anxiety presents with concern for infection of the right foot and ankle. Patient noticed swelling and pain of R ankle last week. She went to UC on 5/1 and was prescribed keflex and doxy which she started on 5/4 and has been taking for 5 days.  Despite this the redness and swelling has progressed.  She endorses chills but no measured fevers.  No history of IV drug use.  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     Past Medical History:  Diagnosis Date   Acid reflux    Allergy    Anxiety    Arthritis    COPD (chronic obstructive pulmonary disease) (HCC)    Depression    High cholesterol    Hypertension    Torn rotator cuff 11/30/2022    Patient Active Problem List   Diagnosis Date Noted   Venous insufficiency 12/15/2022   SUI (stress urinary incontinence, female) 12/15/2022   Encounter for screening mammogram for malignant neoplasm of breast 12/15/2022   Establishing care with new doctor, encounter for 12/15/2022     Physical Exam  Triage Vital Signs: ED Triage Vitals  Enc Vitals Group     BP 04/06/23 1703 131/79     Pulse Rate 04/06/23 1703 81     Resp 04/06/23 1703 17     Temp 04/06/23 1703 98.5 F (36.9 C)     Temp Source 04/06/23 1703 Oral     SpO2 04/06/23 1703 100 %     Weight 04/06/23 1704 200 lb (90.7 kg)     Height --      Head Circumference --      Peak Flow --      Pain Score 04/06/23 1703 5     Pain Loc --      Pain Edu? --      Excl. in GC? --     Most recent vital signs: Vitals:   04/06/23 1703  BP: 131/79  Pulse: 81  Resp: 17  Temp: 98.5 F (36.9 C)  SpO2: 100%     General: Awake, no distress.  CV:  Good peripheral perfusion.  Resp:  Normal  effort.  Abd:  No distention.  Neuro:             Awake, Alert, Oriented x 3  Other:  There is erythema on the dorsal foot extending to the medial malleolus and the medial lower leg mildly tender no crepitus there is no wound or exudate patient is able to range the ankle without pain   ED Results / Procedures / Treatments  Labs (all labs ordered are listed, but only abnormal results are displayed) Labs Reviewed  COMPREHENSIVE METABOLIC PANEL - Abnormal; Notable for the following components:      Result Value   Glucose, Bld 118 (*)    All other components within normal limits  LACTIC ACID, PLASMA - Abnormal; Notable for the following components:   Lactic Acid, Venous 2.3 (*)    All other components within normal limits  CULTURE, BLOOD (ROUTINE X 2)  CULTURE, BLOOD (ROUTINE X 2)  LACTIC ACID, PLASMA  CBC WITH DIFFERENTIAL/PLATELET  URINALYSIS, ROUTINE W REFLEX MICROSCOPIC  EKG    RADIOLOGY I reviewed the ankle x-ray there is no gas   PROCEDURES:  Critical Care performed: No  Procedures  The patient is on the cardiac monitor to evaluate for evidence of arrhythmia and/or significant heart rate changes.   MEDICATIONS ORDERED IN ED: Medications  lactated ringers bolus 1,000 mL (0 mLs Intravenous Stopped 04/06/23 2123)  cephALEXin (KEFLEX) capsule 500 mg (500 mg Oral Given 04/06/23 1930)  doxycycline (VIBRA-TABS) tablet 100 mg (100 mg Oral Given 04/06/23 1931)     IMPRESSION / MDM / ASSESSMENT AND PLAN / ED COURSE  I reviewed the triage vital signs and the nursing notes.                              Patient's presentation is most consistent with acute complicated illness / injury requiring diagnostic workup.  Differential diagnosis includes, but is not limited to, cellulitis, venous stasis, contact dermatitis, low suspicion for necrotizing infection, DVT  The patient is a 57 year old female who presents because of ankle pain and swelling.  This started about a week  ago was seen at urgent care on 5/1 and prescribed Keflex and doxycycline.  She could not afford both antibiotics only picked up the Keflex and has been taking this for 3 days.  The redness has progressed and pain is not improving.  Has had some chills no fevers.  On exam patient does appear to have a cellulitis that involves the dorsum of the foot medial ankle and medial lower leg.  There is some ankle edema but it patient is able to range the ankle without difficulty I have very low suspicion for septic joint.  Considered other diagnosis like venous stasis DVT.  DVT less likely as it really involves mainly the foot and ankle and not the calf.  It is unilateral so lower suspicion for venous stasis but possible.  Patient's vital signs are reassuring.  Labs were drawn from triage including CBC CMP and a lactate.  She has no leukocytosis but lactate is mildly elevated at 2.3.  I have low suspicion that this indicates sepsis.  I did review the picture from urgent care.  Looks like the erythema is slightly higher up on the leg but overall the clinical exam to me does not necessarily indicate patient needs admission.  I have low suspicion for sepsis.  Will give a bolus of fluid and repeat the lactate.  Patient has not been on the doxycycline and I think we can extend the Keflex and add on the doxycycline as she says she will be able to pick it up this time.  Lactate repeated and is normal after bolus of fluid.  Again my suspicion for sepsis is low.  Discharged with prescription for doxycycline.     FINAL CLINICAL IMPRESSION(S) / ED DIAGNOSES   Final diagnoses:  Cellulitis of right lower extremity     Rx / DC Orders   ED Discharge Orders          Ordered    cephALEXin (KEFLEX) 500 MG capsule  4 times daily        04/06/23 2149             Note:  This document was prepared using Dragon voice recognition software and may include unintentional dictation errors.   Georga Hacking,  MD 04/06/23 2150

## 2023-04-06 NOTE — Discharge Instructions (Signed)
Please finish your Keflex and then take the additional 5 days that I have prescribed.  Please start taking the doxycycline as well twice daily for the next 7 days.  Please elevate your leg whenever you are sitting down to help decrease swelling.  If the redness and swelling is worsening after 48 hours then please return to the emergency department

## 2023-04-06 NOTE — ED Triage Notes (Signed)
Pt sts that she recently went to the UC to get abx for her cellulitis of the lower right ext. Pt sts that her foot and ankle are very swollen and red with warmth to the touch.

## 2023-04-06 NOTE — ED Notes (Signed)
First set of Blood Cultures sent to lab with blood.

## 2023-04-07 LAB — CULTURE, BLOOD (ROUTINE X 2): Culture: NO GROWTH

## 2023-04-10 LAB — CULTURE, BLOOD (ROUTINE X 2)

## 2023-04-11 LAB — CULTURE, BLOOD (ROUTINE X 2)

## 2023-05-04 IMAGING — DX DG FOOT COMPLETE 3+V*L*
3 series · 3 of 3 positions shown · non-contrast
Comparison: None.

CLINICAL DATA: Pain, swelling left foot

EXAM:
LEFT FOOT - COMPLETE 3+ VIEW

[foot ap]
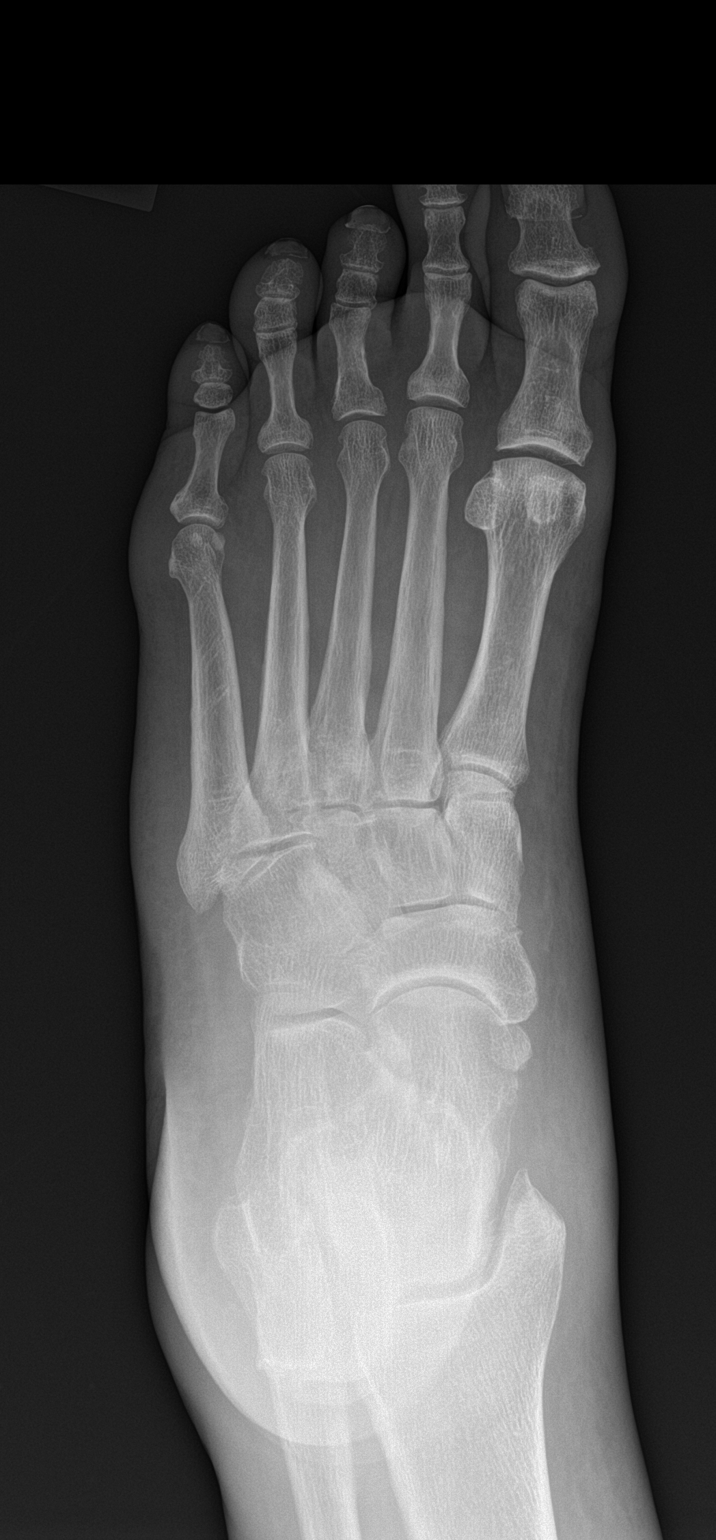

[foot obl]
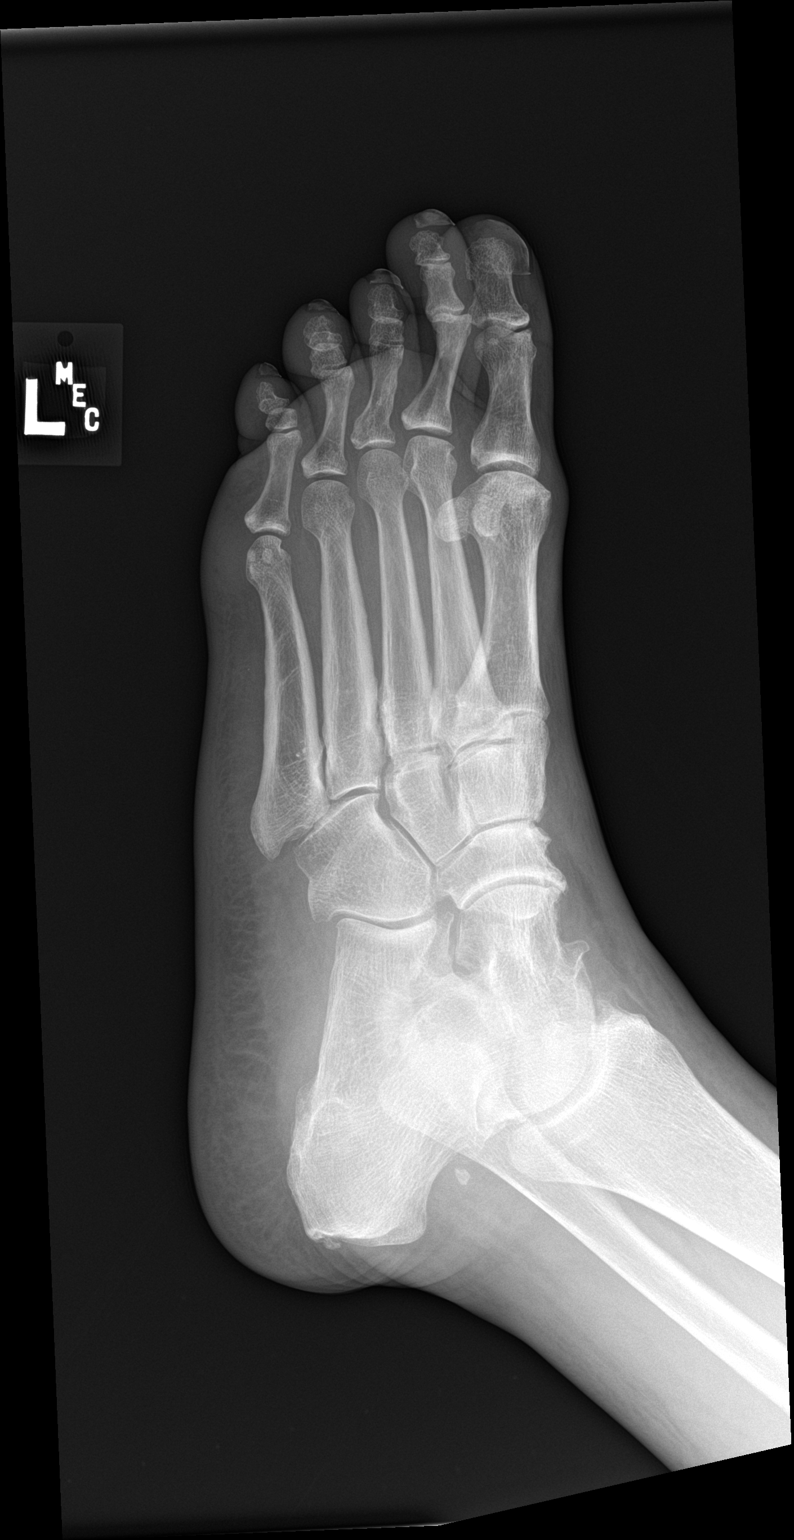

[foot lat]
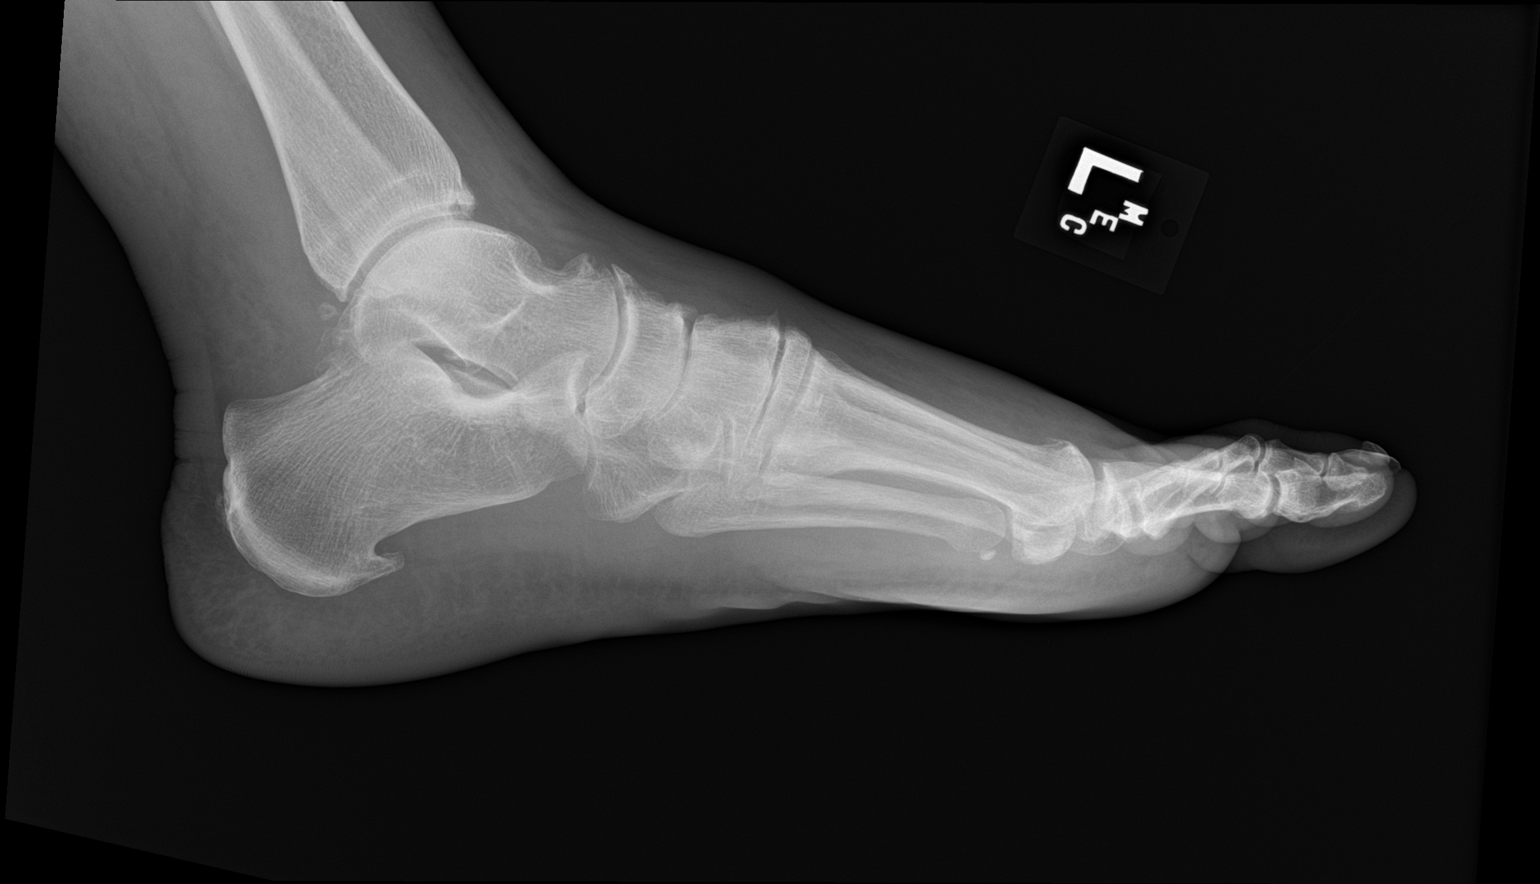

[3 of 3 positions shown; findings below may reference images not displayed]

FINDINGS: Plantar calcaneal spur. No acute bony abnormality. Specifically, no
fracture, subluxation, or dislocation.
IMPRESSION: No acute bony abnormality.

## 2023-11-17 ENCOUNTER — Telehealth: Payer: Self-pay

## 2023-11-18 NOTE — Transitions of Care (Post Inpatient/ED Visit) (Signed)
   11/18/2023  Name: Laurie Mclaughlin MRN: [id number] DOB: 1966-10-02  Today's TOC FU Call Status: Today's TOC FU Call Status:: Unsuccessful Call (2nd Attempt) Unsuccessful Call (2nd Attempt) Date: 11/18/23  Attempted to reach the patient regarding the most recent Inpatient/ED visit.  Follow Up Plan: No further outreach attempts will be made at this time. We have been unable to contact the patient. Error Signature Karena Addison, LPN [facility] Nurse Health Advisor Direct Dial [phone]
